# Patient Record
Sex: Female | Born: 1995
Health system: Southern US, Community
[De-identification: ages and names within clinical notes are randomized; demographics above are authoritative.]

## PROBLEM LIST (undated history)

## (undated) ENCOUNTER — Inpatient Hospital Stay (HOSPITAL_COMMUNITY): Payer: Self-pay

## (undated) DIAGNOSIS — I10 Essential (primary) hypertension: Secondary | ICD-10-CM

## (undated) DIAGNOSIS — E039 Hypothyroidism, unspecified: Secondary | ICD-10-CM

## (undated) DIAGNOSIS — R51 Headache: Secondary | ICD-10-CM

## (undated) DIAGNOSIS — K0889 Other specified disorders of teeth and supporting structures: Secondary | ICD-10-CM

## (undated) DIAGNOSIS — F419 Anxiety disorder, unspecified: Secondary | ICD-10-CM

## (undated) DIAGNOSIS — R519 Headache, unspecified: Secondary | ICD-10-CM

## (undated) DIAGNOSIS — F32A Depression, unspecified: Secondary | ICD-10-CM

## (undated) DIAGNOSIS — A749 Chlamydial infection, unspecified: Secondary | ICD-10-CM

## (undated) DIAGNOSIS — D573 Sickle-cell trait: Secondary | ICD-10-CM

## (undated) DIAGNOSIS — F329 Major depressive disorder, single episode, unspecified: Secondary | ICD-10-CM

## (undated) HISTORY — DX: Essential (primary) hypertension: I10

## (undated) HISTORY — DX: Hypothyroidism, unspecified: E03.9

## (undated) HISTORY — PX: HERNIA REPAIR: SHX51

---
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
## 2015-03-14 NOTE — L&D Delivery Note (Signed)
20 y.o. G1P0 at 5657w1d delivered a viable female infant in cephalic, LOA position.  nuchal cord x 1, easily reduced. right anterior shoulder delivered with ease. 60 sec delayed cord clamping. Cord clamped x2 and cut. Placenta delivered spontaneously intact, with 3VC. Fundus firm on exam with massage and pitocin. Good hemostasis noted.  Laceration: none Suture:  n/a EBL: 200cc Good hemostasis noted.  Mom and baby recovering in LDR.    Apgars: 6,8 Weight: pending, skin to skin, see delivery summary  Cord blood obtained: yes   WALLACE, NOAH I, DO PGY-3 02/09/2016, 12:23 PM   Midwife attestation: I was gloved and present for delivery in its entirety and I agree with the above resident's note.  Donette LarryMelanie Shanisha Lech, CNM 1:39 PM

## 2015-06-16 ENCOUNTER — Encounter: Payer: Self-pay | Admitting: *Deleted

## 2015-06-16 ENCOUNTER — Ambulatory Visit (INDEPENDENT_AMBULATORY_CARE_PROVIDER_SITE_OTHER): Payer: Self-pay | Admitting: *Deleted

## 2015-06-16 ENCOUNTER — Inpatient Hospital Stay (HOSPITAL_COMMUNITY)
Admission: AD | Admit: 2015-06-16 | Discharge: 2015-06-16 | Disposition: A | Discharge status: Home / Self Care | Payer: Medicaid Other | Source: Ambulatory Visit | Attending: Obstetrics & Gynecology | Admitting: Obstetrics & Gynecology

## 2015-06-16 DIAGNOSIS — Z3201 Encounter for pregnancy test, result positive: Secondary | ICD-10-CM

## 2015-06-16 DIAGNOSIS — Z3200 Encounter for pregnancy test, result unknown: Secondary | ICD-10-CM

## 2015-06-16 NOTE — MAU Note (Signed)
Pt presents to MAU stating that she wants to know how far pregnant she is. PT reports LMP 05/11/15. Positive home pregnancy test. Denies any pain of vaginal bleeding

## 2015-06-16 NOTE — Progress Notes (Signed)
Here for pregnancy test today which was positive. [facility]. Given proof of pregnancy.

## 2015-06-17 ENCOUNTER — Inpatient Hospital Stay (HOSPITAL_COMMUNITY)
Admission: AD | Admit: 2015-06-17 | Discharge: 2015-06-17 | Disposition: A | Discharge status: Home / Self Care | Payer: Medicaid Other | Source: Ambulatory Visit | Attending: Obstetrics & Gynecology | Admitting: Obstetrics & Gynecology

## 2015-06-17 ENCOUNTER — Inpatient Hospital Stay (HOSPITAL_COMMUNITY): Payer: Medicaid Other

## 2015-06-17 ENCOUNTER — Encounter (HOSPITAL_COMMUNITY): Payer: Self-pay | Admitting: *Deleted

## 2015-06-17 DIAGNOSIS — N760 Acute vaginitis: Secondary | ICD-10-CM

## 2015-06-17 DIAGNOSIS — O209 Hemorrhage in early pregnancy, unspecified: Secondary | ICD-10-CM | POA: Diagnosis not present

## 2015-06-17 DIAGNOSIS — B9689 Other specified bacterial agents as the cause of diseases classified elsewhere: Secondary | ICD-10-CM

## 2015-06-17 DIAGNOSIS — O23593 Infection of other part of genital tract in pregnancy, third trimester: Secondary | ICD-10-CM

## 2015-06-17 DIAGNOSIS — Z3A01 Less than 8 weeks gestation of pregnancy: Secondary | ICD-10-CM

## 2015-06-17 DIAGNOSIS — O4690 Antepartum hemorrhage, unspecified, unspecified trimester: Secondary | ICD-10-CM

## 2015-06-17 DIAGNOSIS — A499 Bacterial infection, unspecified: Secondary | ICD-10-CM

## 2015-06-17 LAB — CBC
HCT: 32.1 % — ABNORMAL LOW (ref 36.0–46.0)
Hemoglobin: 11.4 g/dL — ABNORMAL LOW (ref 12.0–15.0)
MCH: 31.7 pg (ref 26.0–34.0)
MCHC: 35.5 g/dL (ref 30.0–36.0)
MCV: 89.2 fL (ref 78.0–100.0)
PLATELETS: 193 10*3/uL (ref 150–400)
RBC: 3.6 MIL/uL — ABNORMAL LOW (ref 3.87–5.11)
RDW: 11.8 % (ref 11.5–15.5)
WBC: 3.4 10*3/uL — AB (ref 4.0–10.5)

## 2015-06-17 LAB — URINALYSIS, ROUTINE W REFLEX MICROSCOPIC
Bilirubin Urine: NEGATIVE
GLUCOSE, UA: NEGATIVE mg/dL
KETONES UR: NEGATIVE mg/dL
LEUKOCYTES UA: NEGATIVE
Nitrite: NEGATIVE
PH: 6 (ref 5.0–8.0)
Protein, ur: NEGATIVE mg/dL
SPECIFIC GRAVITY, URINE: 1.015 (ref 1.005–1.030)

## 2015-06-17 LAB — WET PREP, GENITAL
Sperm: NONE SEEN
TRICH WET PREP: NONE SEEN
Yeast Wet Prep HPF POC: NONE SEEN

## 2015-06-17 LAB — URINE MICROSCOPIC-ADD ON: WBC, UA: NONE SEEN WBC/hpf (ref 0–5)

## 2015-06-17 LAB — HCG, QUANTITATIVE, PREGNANCY: HCG, BETA CHAIN, QUANT, S: 5719 m[IU]/mL — AB (ref <5)

## 2015-06-17 LAB — ABO/RH: ABO/RH(D): O POS

## 2015-06-17 LAB — POCT PREGNANCY, URINE: Preg Test, Ur: POSITIVE — AB

## 2015-06-17 MED ORDER — METRONIDAZOLE 500 MG PO TABS: 500.0000 mg | ORAL_TABLET | Freq: Two times a day (BID) | ORAL | Status: DC

## 2015-06-17 NOTE — MAU Provider Note (Signed)
History     CSN: [id number]  Arrival date and time: 06/17/15 1159   None     No chief complaint on file.  HPI   Bonnie Cooper is a 20 yo AA [redacted]w[redacted]d G1P0 presenting to the Maternity Admissions Unit for vaginal bleeding and cramping. She noticed the bleeding this morning when she wiped after voiding and later noticed passing a small clot. She states the bleeding is not enough for a panty liner. Her cramping has been persistent in the lower abdomen.  Pt confirmed pregnancy at the clinic yesterday and was not having problems then.  Pt admits to some constipation. Her last BM was this morning without difficulty evacuating.  Pt denies any fever, chills, vaginal discharge, nausea, or vomiting.   RN note: ed at 06/17/2015 12:29 PM   Expand All Collapse All   Pt had pregnancy confirmed yesterday in clinic and started having bleeding when she wiped today. Reprots some mild cramping as well.       No past medical history on file.  No past surgical history on file.  No family history on file.  Social History  Substance Use Topics  . Smoking status: Not on file  . Smokeless tobacco: Not on file  . Alcohol Use: Not on file    Allergies: Allergies not on file  No prescriptions prior to admission    Review of Systems  Constitutional: Negative for fever and chills.  Gastrointestinal: Positive for abdominal pain and constipation. Negative for nausea, vomiting and diarrhea.  Genitourinary: Negative for dysuria.  Neurological: Negative for headaches.   Physical Exam   Blood pressure 110/79, pulse 85, temperature 98.7 F (37.1 C), temperature source Oral, resp. rate 18, height  (1.6 m), weight 149 lb 9.6 oz (67.858 kg), last menstrual period 05/11/2015.  Physical Exam  Nursing note and vitals reviewed. Constitutional: She is oriented to person, place, and time. She appears well-developed and well-nourished. No distress.  HENT:  Head: Normocephalic and atraumatic.  Eyes: Pupils  are equal, round, and reactive to light.  Neck: Normal range of motion. Neck supple.  Cardiovascular: Normal rate.   Respiratory: Effort normal.  GI: Soft. She exhibits no distension. There is no tenderness. There is no rebound and no guarding.  Musculoskeletal: Normal range of motion.  Neurological: She is alert and oriented to person, place, and time.  Skin: Skin is warm and dry.  Psychiatric: She has a normal mood and affect.   External genital exam was normal.  Speculum exam revealed opaque bloody discharge. Cervix was pink and clear. No CMT.  Uterus was appropriate and non-tender Adenxa without masses or tenderness   MAU Course  Procedures Results for orders placed or performed during the hospital encounter of 06/17/15 (from the past 24 hour(s))  Urinalysis, Routine w reflex microscopic (not at [facility])     Status: Abnormal   Collection Time: 06/17/15 12:33 PM  Result Value Ref Range   Color, Urine YELLOW YELLOW   APPearance CLEAR CLEAR   Specific Gravity, Urine 1.015 1.005 - 1.030   pH 6.0 5.0 - 8.0   Glucose, UA NEGATIVE NEGATIVE mg/dL   Hgb urine dipstick MODERATE (A) NEGATIVE   Bilirubin Urine NEGATIVE NEGATIVE   Ketones, ur NEGATIVE NEGATIVE mg/dL   Protein, ur NEGATIVE NEGATIVE mg/dL   Nitrite NEGATIVE NEGATIVE   Leukocytes, UA NEGATIVE NEGATIVE  Urine microscopic-add on     Status: Abnormal   Collection Time: 06/17/15 12:33 PM  Result Value Ref Range   Squamous  Epithelial / LPF 0-5 (A) NONE SEEN   WBC, UA NONE SEEN 0 - 5 WBC/hpf   RBC / HPF 0-5 0 - 5 RBC/hpf   Bacteria, UA RARE (A) NONE SEEN  Wet prep, genital     Status: Abnormal   Collection Time: 06/17/15 12:55 PM  Result Value Ref Range   Yeast Wet Prep HPF POC NONE SEEN NONE SEEN   Trich, Wet Prep NONE SEEN NONE SEEN   Clue Cells Wet Prep HPF POC PRESENT (A) NONE SEEN   WBC, Wet Prep HPF POC FEW (A) NONE SEEN   Sperm NONE SEEN   ABO/Rh     Status: None   Collection Time: 06/17/15 12:58 PM  Result Value  Ref Range   ABO/RH(D) O POS   hCG, quantitative, pregnancy     Status: Abnormal   Collection Time: 06/17/15 12:59 PM  Result Value Ref Range   hCG, Beta Chain, Quant, S 5719 (H) <5 mIU/mL  CBC     Status: Abnormal   Collection Time: 06/17/15 12:59 PM  Result Value Ref Range   WBC 3.4 (L) 4.0 - 10.5 K/uL   RBC 3.60 (L) 3.87 - 5.11 MIL/uL   Hemoglobin 11.4 (L) 12.0 - 15.0 g/dL   HCT 16.1 (L) 09.6 - 04.5 %   MCV 89.2 78.0 - 100.0 fL   MCH 31.7 26.0 - 34.0 pg   MCHC 35.5 30.0 - 36.0 g/dL   RDW 40.9 81.1 - 91.4 %   Platelets 193 150 - 400 K/uL  US Ob Comp Less 14 Wks  06/17/2015  CLINICAL DATA:  Vaginal bleeding. EXAM: OBSTETRIC <14 WK [place] AND TRANSVAGINAL OB US TECHNIQUE: Both transabdominal and transvaginal ultrasound examinations were performed for complete evaluation of the gestation as well as the maternal uterus, adnexal regions, and pelvic cul-de-sac. Transvaginal technique was performed to assess early pregnancy. COMPARISON:  None. FINDINGS: Intrauterine gestational sac: Visualized/normal in shape. Yolk sac:  Questionable small yolk sac. Embryo:  Not visualized. Cardiac Activity: Not visualized. MSD: 0.6 cm 5 w   2  d Subchorionic hemorrhage:  Questionable small. Maternal uterus/adnexae: Probable small right ovarian corpus luteal cyst. IMPRESSION: 1. Probable early intrauterine gestational sac, but no fetal pole, or cardiac activity yet visualized. Recommend follow-up quantitative B-HCG levels and follow-up US in 14 days to confirm and assess viability. This recommendation follows SRU consensus guidelines: Diagnostic Criteria for Nonviable Pregnancy Early in the First Trimester. Malva Limes Med 2013; 782:9562-13. 2. Questionable small subchorionic hemorrhage. Continued follow-up suggested. Electronically Signed   By: Maisie Fus  Register   On: 06/17/2015 16:11   US Ob Transvaginal  06/17/2015  CLINICAL DATA:  Vaginal bleeding. EXAM: OBSTETRIC <14 WK [place] AND TRANSVAGINAL OB US TECHNIQUE: Both  transabdominal and transvaginal ultrasound examinations were performed for complete evaluation of the gestation as well as the maternal uterus, adnexal regions, and pelvic cul-de-sac. Transvaginal technique was performed to assess early pregnancy. COMPARISON:  None. FINDINGS: Intrauterine gestational sac: Visualized/normal in shape. Yolk sac:  Questionable small yolk sac. Embryo:  Not visualized. Cardiac Activity: Not visualized. MSD: 0.6 cm 5 w   2  d Subchorionic hemorrhage:  Questionable small. Maternal uterus/adnexae: Probable small right ovarian corpus luteal cyst. IMPRESSION: 1. Probable early intrauterine gestational sac, but no fetal pole, or cardiac activity yet visualized. Recommend follow-up quantitative B-HCG levels and follow-up US in 14 days to confirm and assess viability. This recommendation follows SRU consensus guidelines: Diagnostic Criteria for Nonviable Pregnancy Early in the First Trimester. N Engl  J Med 2013; 161:0960-45; 369:1443-51. 2. Questionable small subchorionic hemorrhage. Continued follow-up suggested. Electronically Signed   By: Maisie Fushomas  Register   On: 06/17/2015 16:11  GC/chlamydia pending  Assessment and Plan  Bleeding in pregnancy- ?YS f/u in 48 hours for repeat HCG in MAU (Sat) BV- flagyl 500mg  BID for 7 days Pt instructed to return to MAU prior to scheduled visit if increase in pain or bleeding  Prather Failla 06/17/2015, 2:02 PM

## 2015-06-17 NOTE — MAU Note (Addendum)
Pt had pregnancy confirmed yesterday in clinic and started having bleeding when she wiped today. Reprots some mild cramping as well.

## 2015-06-18 LAB — HIV ANTIBODY (ROUTINE TESTING W REFLEX): HIV Screen 4th Generation wRfx: NONREACTIVE

## 2015-06-18 LAB — GC/CHLAMYDIA PROBE AMP (~~LOC~~) NOT AT ARMC
CHLAMYDIA, DNA PROBE: NEGATIVE
Neisseria Gonorrhea: NEGATIVE

## 2015-06-19 ENCOUNTER — Inpatient Hospital Stay (HOSPITAL_COMMUNITY)
Admission: AD | Admit: 2015-06-19 | Discharge: 2015-06-19 | Disposition: A | Discharge status: Home / Self Care | Payer: Medicaid Other | Source: Ambulatory Visit | Attending: Obstetrics & Gynecology | Admitting: Obstetrics & Gynecology

## 2015-06-19 DIAGNOSIS — Z3A01 Less than 8 weeks gestation of pregnancy: Secondary | ICD-10-CM | POA: Diagnosis not present

## 2015-06-19 DIAGNOSIS — O468X1 Other antepartum hemorrhage, first trimester: Secondary | ICD-10-CM | POA: Diagnosis not present

## 2015-06-19 DIAGNOSIS — O209 Hemorrhage in early pregnancy, unspecified: Secondary | ICD-10-CM

## 2015-06-19 LAB — HCG, QUANTITATIVE, PREGNANCY: HCG, BETA CHAIN, QUANT, S: 10911 m[IU]/mL — AB (ref <5)

## 2015-06-19 NOTE — MAU Provider Note (Signed)
History   Chief Complaint:  Follow-up   Bonnie Cooper is  20 y.o. G1P0 Patient's last menstrual period was 05/11/2015.Marland Kitchen Patient is here for follow up of quantitative HCG and ongoing surveillance of pregnancy status.   She is [redacted]w[redacted]d weeks gestation  by early ultrasound.    Since her last visit, the patient is without new complaint.   The patient reports bleeding as  brown.    General ROS:  negative  Her previous Quantitative HCG values are:  Ref. Range 06/17/2015 12:59  HCG, Beta Chain, Quant, S Latest Ref Range: <5 mIU/mL 5719 (H)    RN Note: "just hungry". Has had a little bit of cramping, some brown stuff came out yesterday, then some bleeding, little more brown stuff this morning    Physical Exam   Blood pressure 103/55, pulse 91, temperature 98.5 F (36.9 C), temperature source Oral, resp. rate 16, last menstrual period 05/11/2015.  Focused Gynecological Exam: exam declined by the patient                                                       She is awake and alert, in no apparent distress                                                       Heart rate RRR, breathing unlabored                                                       Ambulates without assistance  Labs: Results for orders placed or performed during the hospital encounter of 06/19/15 (from the past 24 hour(s))  hCG, quantitative, pregnancy   Collection Time: 06/19/15  9:18 AM  Result Value Ref Range   hCG, Beta Chain, Quant, S 10911 (H) <5 mIU/mL    Ultrasound Studies:   US Ob Comp Less 14 Wks  06/17/2015  CLINICAL DATA:  Vaginal bleeding. EXAM: OBSTETRIC <14 WK [place] AND TRANSVAGINAL OB US TECHNIQUE: Both transabdominal and transvaginal ultrasound examinations were performed for complete evaluation of the gestation as well as the maternal uterus, adnexal regions, and pelvic cul-de-sac. Transvaginal technique was performed to assess early pregnancy. COMPARISON:  None. FINDINGS: Intrauterine gestational sac:  Visualized/normal in shape. Yolk sac:  Questionable small yolk sac. Embryo:  Not visualized. Cardiac Activity: Not visualized. MSD: 0.6 cm 5 w   2  d Subchorionic hemorrhage:  Questionable small. Maternal uterus/adnexae: Probable small right ovarian corpus luteal cyst. IMPRESSION: 1. Probable early intrauterine gestational sac, but no fetal pole, or cardiac activity yet visualized. Recommend follow-up quantitative B-HCG levels and follow-up US in 14 days to confirm and assess viability. This recommendation follows SRU consensus guidelines: Diagnostic Criteria for Nonviable Pregnancy Early in the First Trimester. Malva Limes Med 2013; 161:0960-45. 2. Questionable small subchorionic hemorrhage. Continued follow-up suggested. Electronically Signed   By: Maisie Fus  Register   On: 06/17/2015 16:11   US Ob Transvaginal  06/17/2015  CLINICAL DATA:  Vaginal bleeding. EXAM: OBSTETRIC <14 WK [place]  AND TRANSVAGINAL OB US TECHNIQUE: Both transabdominal and transvaginal ultrasound examinations were performed for complete evaluation of the gestation as well as the maternal uterus, adnexal regions, and pelvic cul-de-sac. Transvaginal technique was performed to assess early pregnancy. COMPARISON:  None. FINDINGS: Intrauterine gestational sac: Visualized/normal in shape. Yolk sac:  Questionable small yolk sac. Embryo:  Not visualized. Cardiac Activity: Not visualized. MSD: 0.6 cm 5 w   2  d Subchorionic hemorrhage:  Questionable small. Maternal uterus/adnexae: Probable small right ovarian corpus luteal cyst. IMPRESSION: 1. Probable early intrauterine gestational sac, but no fetal pole, or cardiac activity yet visualized. Recommend follow-up quantitative B-HCG levels and follow-up US in 14 days to confirm and assess viability. This recommendation follows SRU consensus guidelines: Diagnostic Criteria for Nonviable Pregnancy Early in the First Trimester. Malva Limes Engl J Med 2013; 962:9528-41; 369:1443-51. 2. Questionable small subchorionic hemorrhage. Continued  follow-up suggested. Electronically Signed   By: Maisie Fushomas  Register   On: 06/17/2015 16:11    Assessment:  1469w4d weeks gestation   Quant HCG levels doubled appropriately   Plan: The patient is instructed to follow up in in the next few days For follow up ultrasound late next week.  [facility] Gastroenterology IncWILLIAMS,MARIE 06/19/2015, 10:40 AM

## 2015-06-19 NOTE — MAU Note (Signed)
"  just hungry".  Has had a little bit of cramping, some brown stuff came out yesterday, then some bleeding, little more brown stuff this morning

## 2015-06-19 NOTE — Discharge Instructions (Signed)

## 2015-06-29 ENCOUNTER — Ambulatory Visit (HOSPITAL_COMMUNITY): Payer: Self-pay | Attending: Advanced Practice Midwife

## 2015-07-07 ENCOUNTER — Inpatient Hospital Stay (HOSPITAL_COMMUNITY)
Admission: AD | Admit: 2015-07-07 | Discharge: 2015-07-07 | Disposition: A | Discharge status: Home / Self Care | Payer: Medicaid Other | Source: Ambulatory Visit | Attending: Obstetrics and Gynecology | Admitting: Obstetrics and Gynecology

## 2015-07-07 ENCOUNTER — Inpatient Hospital Stay (HOSPITAL_COMMUNITY): Payer: Medicaid Other

## 2015-07-07 ENCOUNTER — Encounter (HOSPITAL_COMMUNITY): Payer: Self-pay | Admitting: *Deleted

## 2015-07-07 DIAGNOSIS — R109 Unspecified abdominal pain: Secondary | ICD-10-CM | POA: Diagnosis not present

## 2015-07-07 DIAGNOSIS — B373 Candidiasis of vulva and vagina: Secondary | ICD-10-CM

## 2015-07-07 DIAGNOSIS — O26899 Other specified pregnancy related conditions, unspecified trimester: Secondary | ICD-10-CM

## 2015-07-07 DIAGNOSIS — D573 Sickle-cell trait: Secondary | ICD-10-CM | POA: Insufficient documentation

## 2015-07-07 DIAGNOSIS — O26891 Other specified pregnancy related conditions, first trimester: Secondary | ICD-10-CM | POA: Insufficient documentation

## 2015-07-07 DIAGNOSIS — B3731 Acute candidiasis of vulva and vagina: Secondary | ICD-10-CM

## 2015-07-07 DIAGNOSIS — O98811 Other maternal infectious and parasitic diseases complicating pregnancy, first trimester: Secondary | ICD-10-CM | POA: Insufficient documentation

## 2015-07-07 DIAGNOSIS — Z3A08 8 weeks gestation of pregnancy: Secondary | ICD-10-CM | POA: Insufficient documentation

## 2015-07-07 DIAGNOSIS — O99011 Anemia complicating pregnancy, first trimester: Secondary | ICD-10-CM | POA: Diagnosis not present

## 2015-07-07 DIAGNOSIS — O9989 Other specified diseases and conditions complicating pregnancy, childbirth and the puerperium: Secondary | ICD-10-CM

## 2015-07-07 DIAGNOSIS — Z3491 Encounter for supervision of normal pregnancy, unspecified, first trimester: Secondary | ICD-10-CM

## 2015-07-07 HISTORY — DX: Headache: R51

## 2015-07-07 HISTORY — DX: Sickle-cell trait: D57.3

## 2015-07-07 HISTORY — DX: Major depressive disorder, single episode, unspecified: F32.9

## 2015-07-07 HISTORY — DX: Chlamydial infection, unspecified: A74.9

## 2015-07-07 HISTORY — DX: Anxiety disorder, unspecified: F41.9

## 2015-07-07 HISTORY — DX: Headache, unspecified: R51.9

## 2015-07-07 HISTORY — DX: Depression, unspecified: F32.A

## 2015-07-07 LAB — URINALYSIS, ROUTINE W REFLEX MICROSCOPIC
BILIRUBIN URINE: NEGATIVE
GLUCOSE, UA: NEGATIVE mg/dL
Nitrite: NEGATIVE
PH: 5.5 (ref 5.0–8.0)
Protein, ur: NEGATIVE mg/dL
Specific Gravity, Urine: 1.025 (ref 1.005–1.030)

## 2015-07-07 LAB — URINE MICROSCOPIC-ADD ON: RBC / HPF: NONE SEEN RBC/hpf (ref 0–5)

## 2015-07-07 MED ORDER — TERCONAZOLE 0.8 % VA CREA: 1.0000 | TOPICAL_CREAM | Freq: Every day | VAGINAL | Status: DC

## 2015-07-07 NOTE — Discharge Instructions (Signed)
Monilial Vaginitis °Vaginitis in a soreness, swelling and redness (inflammation) of the vagina and vulva. Monilial vaginitis is not a sexually transmitted infection. °CAUSES  °Yeast vaginitis is caused by yeast (candida) that is normally found in your vagina. With a yeast infection, the candida has overgrown in number to a point that upsets the chemical balance. °SYMPTOMS  °· White, thick vaginal discharge. °· Swelling, itching, redness and irritation of the vagina and possibly the lips of the vagina (vulva). °· Burning or painful urination. °· Painful intercourse. °DIAGNOSIS  °Things that may contribute to monilial vaginitis are: °· Postmenopausal and virginal states. °· Pregnancy. °· Infections. °· Being tired, sick or stressed, especially if you had monilial vaginitis in the past. °· Diabetes. Good control will help lower the chance. °· Birth control pills. °· Tight fitting garments. °· Using bubble bath, feminine sprays, douches or deodorant tampons. °· Taking certain medications that kill germs (antibiotics). °· Sporadic recurrence can occur if you become ill. °TREATMENT  °Your caregiver will give you medication. °· There are several kinds of anti monilial vaginal creams and suppositories specific for monilial vaginitis. For recurrent yeast infections, use a suppository or cream in the vagina 2 times a week, or as directed. °· Anti-monilial or steroid cream for the itching or irritation of the vulva may also be used. Get your caregiver's permission. °· Painting the vagina with methylene blue solution may help if the monilial cream does not work. °· Eating yogurt may help prevent monilial vaginitis. °HOME CARE INSTRUCTIONS  °· Finish all medication as prescribed. °· Do not have sex until treatment is completed or after your caregiver tells you it is okay. °· Take warm sitz baths. °· Do not douche. °· Do not use tampons, especially scented ones. °· Wear cotton underwear. °· Avoid tight pants and panty  hose. °· Tell your sexual partner that you have a yeast infection. They should go to their caregiver if they have symptoms such as mild rash or itching. °· Your sexual partner should be treated as well if your infection is difficult to eliminate. °· Practice safer sex. Use condoms. °· Some vaginal medications cause latex condoms to fail. Vaginal medications that harm condoms are: °· Cleocin cream. °· Butoconazole (Femstat®). °· Terconazole (Terazol®) vaginal suppository. °· Miconazole (Monistat®) (may be purchased over the counter). °SEEK MEDICAL CARE IF:  °· You have a temperature by mouth above 102° F (38.9° C). °· The infection is getting worse after 2 days of treatment. °· The infection is not getting better after 3 days of treatment. °· You develop blisters in or around your vagina. °· You develop vaginal bleeding, and it is not your menstrual period. °· You have pain when you urinate. °· You develop intestinal problems. °· You have pain with sexual intercourse. °  °This information is not intended to replace advice given to you by your health care provider. Make sure you discuss any questions you have with your health care provider. °  °Document Released: 12/07/2004 Document Revised: 05/22/2011 Document Reviewed: 08/31/2014 °Elsevier Interactive Patient Education ©2016 Elsevier Inc. °First Trimester of Pregnancy °The first trimester of pregnancy is from week 1 until the end of week 12 (months 1 through 3). During this time, your baby will begin to develop inside you. At 6-8 weeks, the eyes and face are formed, and the heartbeat can be seen on ultrasound. At the end of 12 weeks, all the baby's organs are formed. Prenatal care is all the medical care you receive before the   birth of your baby. Make sure you get good prenatal care and follow all of your doctor's instructions. HOME CARE  Medicines  Take medicine only as told by your doctor. Some medicines are safe and some are not during pregnancy.  Take your  prenatal vitamins as told by your doctor.  Take medicine that helps you poop (stool softener) as needed if your doctor says it is okay. Diet  Eat regular, healthy meals.  Your doctor will tell you the amount of weight gain that is right for you.  Avoid raw meat and uncooked cheese.  If you feel sick to your stomach (nauseous) or throw up (vomit):  Eat 4 or 5 small meals a day instead of 3 large meals.  Try eating a few soda crackers.  Drink liquids between meals instead of during meals.  If you have a hard time pooping (constipation):  Eat high-fiber foods like fresh vegetables, fruit, and whole grains.  Drink enough fluids to keep your pee (urine) clear or pale yellow. Activity and Exercise  Exercise only as told by your doctor. Stop exercising if you have cramps or pain in your lower belly (abdomen) or low back.  Try to avoid standing for long periods of time. Move your legs often if you must stand in one place for a long time.  Avoid heavy lifting.  Wear low-heeled shoes. Sit and stand up straight.  You can have sex unless your doctor tells you not to. Relief of Pain or Discomfort  Wear a good support bra if your breasts are sore.  Take warm water baths (sitz baths) to soothe pain or discomfort caused by hemorrhoids. Use hemorrhoid cream if your doctor says it is okay.  Rest with your legs raised if you have leg cramps or low back pain.  Wear support hose if you have puffy, bulging veins (varicose veins) in your legs. Raise (elevate) your feet for 15 minutes, 3-4 times a day. Limit salt in your diet. Prenatal Care  Schedule your prenatal visits by the twelfth week of pregnancy.  Write down your questions. Take them to your prenatal visits.  Keep all your prenatal visits as told by your doctor. Safety  Wear your seat belt at all times when driving.  Make a list of emergency phone numbers. The list should include numbers for family, friends, the hospital, and  police and fire departments. General Tips  Ask your doctor for a referral to a local prenatal class. Begin classes no later than at the start of month 6 of your pregnancy.  Ask for help if you need counseling or help with nutrition. Your doctor can give you advice or tell you where to go for help.  Do not use hot tubs, steam rooms, or saunas.  Do not douche or use tampons or scented sanitary pads.  Do not cross your legs for long periods of time.  Avoid litter boxes and soil used by cats.  Avoid all smoking, herbs, and alcohol. Avoid drugs not approved by your doctor.  Do not use any tobacco products, including cigarettes, chewing tobacco, and electronic cigarettes. If you need help quitting, ask your doctor. You may get counseling or other support to help you quit.  Visit your dentist. At home, brush your teeth with a soft toothbrush. Be gentle when you floss. GET HELP IF:  You are dizzy.  You have mild cramps or pressure in your lower belly.  You have a nagging pain in your belly area.  You continue  to feel sick to your stomach, throw up, or have watery poop (diarrhea).  You have a bad smelling fluid coming from your vagina.  You have pain with peeing (urination).  You have increased puffiness (swelling) in your face, hands, legs, or ankles. GET HELP RIGHT AWAY IF:   You have a fever.  You are leaking fluid from your vagina.  You have spotting or bleeding from your vagina.  You have very bad belly cramping or pain.  You gain or lose weight rapidly.  You throw up blood. It may look like coffee grounds.  You are around people who have [place] measles, fifth disease, or chickenpox.  You have a very bad headache.  You have shortness of breath.  You have any kind of trauma, such as from a fall or a car accident.   This information is not intended to replace advice given to you by your health care provider. Make sure you discuss any questions you have with your  health care provider.   Document Released: 08/16/2007 Document Revised: 03/20/2014 Document Reviewed: 01/07/2013 Elsevier Interactive Patient Education [employer]2016 Elsevier Inc.

## 2015-07-07 NOTE — MAU Note (Signed)
Pain started last night at work. Describes as intermttant and sharp. States it happened twice, last time around 0300.Pink discharge for about a week.

## 2015-07-07 NOTE — MAU Provider Note (Signed)
Assumed care from Vonzella NippleJulie Wenzel PA @ [phone].  Ultrasound results pending.  Patient declines pelvic exam. Is complaining of vaginal irritation, unsure if she's having discharge; pt thinks she has a yeast infections. Yeast found on u/a so will treat.  Ultrasound shows SIUP with cardiac activity.  Patient plans on getting care with [facility].   A: 1. Normal IUP (intrauterine pregnancy) on prenatal ultrasound, first trimester   2. Abdominal pain affecting pregnancy, antepartum   3. Vaginal yeast infection     P; Discharge home Rx terazol Continues prenatal vitamins Start prenatal care Discussed reasons to return to MAU  Judeth HornErin Petina Muraski, NP

## 2015-07-07 NOTE — MAU Provider Note (Signed)
  History     CSN: [id number]  Arrival date and time: 07/07/15 [id number]   First Provider Initiated Contact with Patient 07/07/15 [phone]      Chief Complaint  Patient presents with  . Abdominal Pain   HPI  Ms. Bonnie Cooper is a 20 y.o. G1P0 at 7732w1d who presents to MAU today with complaint of abdominal pain. The patient states pain is sharp and intermittent. Pain started last night while at work. She has not taken anything for pain. She states pink discharge x 1 week, but denies heavy vaginal bleeding. She denies UTI symptoms or fever. She has had nausea recently and one episode of emesis this morning.   OB History    Gravida Para Term Preterm AB TAB SAB Ectopic Multiple Living   1               Past Medical History  Diagnosis Date  . Anxiety   . Headache   . Depression     just recently, no meds. or tx  . Chlamydia   . Sickle cell trait [facility](HCC)     Past Surgical History  Procedure Laterality Date  . Navel  2001  . Hernia repair      History reviewed. No pertinent family history.  Social History  Substance Use Topics  . Smoking status: Never Smoker   . Smokeless tobacco: None  . Alcohol Use: No     Comment: 1 year ago    Allergies: No Known Allergies  Prescriptions prior to admission  Medication Sig Dispense Refill Last Dose  . metroNIDAZOLE (FLAGYL) 500 MG tablet Take 1 tablet (500 mg total) by mouth 2 (two) times daily. 14 tablet 0     Review of Systems  Constitutional: Negative for fever and malaise/fatigue.  Gastrointestinal: Positive for nausea, vomiting and abdominal pain. Negative for diarrhea and constipation.  Genitourinary: Negative for dysuria, urgency and frequency.       + spotting   Physical Exam   Blood pressure 107/67, pulse 82, temperature 99.2 F (37.3 C), temperature source Oral, resp. rate 18, last menstrual period 05/11/2015.  Physical Exam  Nursing note and vitals reviewed. Constitutional: She is oriented to person, place, and time. She  appears well-developed and well-nourished. No distress.  HENT:  Head: Normocephalic and atraumatic.  Cardiovascular: Normal rate.   Respiratory: Effort normal.  GI: Soft. She exhibits no distension and no mass. There is no tenderness. There is no rebound and no guarding.  Neurological: She is alert and oriented to person, place, and time.  Skin: Skin is warm and dry. No erythema.  Psychiatric: She has a normal mood and affect.   No results found for this or any previous visit (from the past 24 hour(s)).  No results found.  MAU Course  Procedures None  MDM Reviewed notes and results from previous visit. US showed IUGS with possible YS. Follow-up US was ordered and never scheduled.  UA ordered. US transvaginal OB ordered to confirm IUP 0920 - patient in US. Care turned over to Judeth HornErin Lawrence, NP  Marny LowensteinJulie N Wenzel, PA-C  07/07/2015, 9:19 AM  Assessment and Plan

## 2015-07-26 ENCOUNTER — Other Ambulatory Visit (HOSPITAL_COMMUNITY): Payer: Self-pay | Admitting: Nurse Practitioner

## 2015-07-26 DIAGNOSIS — Z3A13 13 weeks gestation of pregnancy: Secondary | ICD-10-CM

## 2015-07-26 DIAGNOSIS — Z3682 Encounter for antenatal screening for nuchal translucency: Secondary | ICD-10-CM

## 2015-07-26 LAB — OB RESULTS CONSOLE RPR: RPR: NONREACTIVE

## 2015-07-26 LAB — OB RESULTS CONSOLE RUBELLA ANTIBODY, IGM: RUBELLA: IMMUNE

## 2015-07-26 LAB — OB RESULTS CONSOLE GC/CHLAMYDIA: Gonorrhea: NEGATIVE

## 2015-07-26 LAB — OB RESULTS CONSOLE HEPATITIS B SURFACE ANTIGEN: Hepatitis B Surface Ag: NEGATIVE

## 2015-08-11 ENCOUNTER — Ambulatory Visit (HOSPITAL_COMMUNITY)
Admission: RE | Admit: 2015-08-11 | Discharge: 2015-08-11 | Disposition: A | Discharge status: Home / Self Care | Payer: Medicaid Other | Source: Ambulatory Visit | Attending: Nurse Practitioner | Admitting: Nurse Practitioner

## 2015-08-11 ENCOUNTER — Encounter (HOSPITAL_COMMUNITY): Payer: Self-pay

## 2015-08-11 DIAGNOSIS — Z36 Encounter for antenatal screening of mother: Secondary | ICD-10-CM | POA: Insufficient documentation

## 2015-08-11 DIAGNOSIS — Z3A13 13 weeks gestation of pregnancy: Secondary | ICD-10-CM | POA: Insufficient documentation

## 2015-08-11 DIAGNOSIS — Z3682 Encounter for antenatal screening for nuchal translucency: Secondary | ICD-10-CM

## 2015-08-20 ENCOUNTER — Other Ambulatory Visit (HOSPITAL_COMMUNITY): Payer: Self-pay

## 2015-08-23 ENCOUNTER — Inpatient Hospital Stay (HOSPITAL_COMMUNITY)
Admission: AD | Admit: 2015-08-23 | Discharge: 2015-08-23 | Disposition: A | Discharge status: Home / Self Care | Payer: Medicaid Other | Source: Ambulatory Visit | Attending: Obstetrics and Gynecology | Admitting: Obstetrics and Gynecology

## 2015-08-23 ENCOUNTER — Encounter (HOSPITAL_COMMUNITY): Payer: Self-pay | Admitting: *Deleted

## 2015-08-23 DIAGNOSIS — O26892 Other specified pregnancy related conditions, second trimester: Secondary | ICD-10-CM | POA: Diagnosis not present

## 2015-08-23 DIAGNOSIS — D573 Sickle-cell trait: Secondary | ICD-10-CM | POA: Insufficient documentation

## 2015-08-23 DIAGNOSIS — W109XXA Fall (on) (from) unspecified stairs and steps, initial encounter: Secondary | ICD-10-CM | POA: Insufficient documentation

## 2015-08-23 DIAGNOSIS — Z3A14 14 weeks gestation of pregnancy: Secondary | ICD-10-CM

## 2015-08-23 DIAGNOSIS — O9989 Other specified diseases and conditions complicating pregnancy, childbirth and the puerperium: Secondary | ICD-10-CM | POA: Diagnosis not present

## 2015-08-23 DIAGNOSIS — T1490 Injury, unspecified: Secondary | ICD-10-CM

## 2015-08-23 DIAGNOSIS — O9A212 Injury, poisoning and certain other consequences of external causes complicating pregnancy, second trimester: Secondary | ICD-10-CM

## 2015-08-23 DIAGNOSIS — R109 Unspecified abdominal pain: Secondary | ICD-10-CM | POA: Diagnosis not present

## 2015-08-23 DIAGNOSIS — W19XXXA Unspecified fall, initial encounter: Secondary | ICD-10-CM

## 2015-08-23 DIAGNOSIS — O26899 Other specified pregnancy related conditions, unspecified trimester: Secondary | ICD-10-CM

## 2015-08-23 LAB — URINALYSIS, ROUTINE W REFLEX MICROSCOPIC
BILIRUBIN URINE: NEGATIVE
GLUCOSE, UA: NEGATIVE mg/dL
Hgb urine dipstick: NEGATIVE
KETONES UR: NEGATIVE mg/dL
Leukocytes, UA: NEGATIVE
Nitrite: NEGATIVE
PH: 6.5 (ref 5.0–8.0)
Protein, ur: NEGATIVE mg/dL
Specific Gravity, Urine: 1.01 (ref 1.005–1.030)

## 2015-08-23 NOTE — MAU Note (Signed)
Pt presents to MAU with complaints of tripping and falling last night landing on the right upper portion of her abdomen. Denies any vaginal bleeding or abnormal discharge. Reports soreness

## 2015-08-23 NOTE — MAU Provider Note (Signed)
History     CSN: [id number]  Arrival date and time: 08/23/15 4098   First Provider Initiated Contact with Patient 08/23/15 1007      Chief Complaint  Patient presents with  . Fall   HPI   [name] is a 20 y.o. female G1P0 at [redacted]w[redacted]d here in MAU with concerns after a fall she endured yesterday evening. She was walking down the stairs and tripped and fell onto her side. She did not directly hit her abdomen.   She [facility] and has an appointment with them on Thurs.   She denies vaginal bleeding.  She is having mild cramping in her lower abdomen. She rates the pain 3/10; the pain is constant, however mild in nature.  She denies leaking of fluid.   OB History    Gravida Para Term Preterm AB TAB SAB Ectopic Multiple Living   1               Past Medical History  Diagnosis Date  . Anxiety   . Headache   . Depression     just recently, no meds. or tx  . Chlamydia   . Sickle cell trait [facility])     Past Surgical History  Procedure Laterality Date  . Navel  2001  . Hernia repair      History reviewed. No pertinent family history.  Social History  Substance Use Topics  . Smoking status: Never Smoker   . Smokeless tobacco: None  . Alcohol Use: No     Comment: 1 year ago    Allergies: No Known Allergies  Prescriptions prior to admission  Medication Sig Dispense Refill Last Dose  . terconazole (TERAZOL 3) 0.8 % vaginal cream Place 1 applicator vaginally at bedtime. 20 g 0    Results for orders placed or performed during the hospital encounter of 08/23/15 (from the past 48 hour(s))  Urinalysis, Routine w reflex microscopic (not at [facility])     Status: None   Collection Time: 08/23/15  9:36 AM  Result Value Ref Range   Color, Urine YELLOW YELLOW   APPearance CLEAR CLEAR   Specific Gravity, Urine 1.010 1.005 - 1.030   pH 6.5 5.0 - 8.0   Glucose, UA NEGATIVE NEGATIVE mg/dL   Hgb urine dipstick NEGATIVE NEGATIVE   Bilirubin Urine  NEGATIVE NEGATIVE   Ketones, ur NEGATIVE NEGATIVE mg/dL   Protein, ur NEGATIVE NEGATIVE mg/dL   Nitrite NEGATIVE NEGATIVE   Leukocytes, UA NEGATIVE NEGATIVE    Comment: MICROSCOPIC NOT DONE ON URINES WITH NEGATIVE PROTEIN, BLOOD, LEUKOCYTES, NITRITE, OR GLUCOSE <1000 mg/dL.    Review of Systems  Constitutional: Negative for fever and chills.  Gastrointestinal: Positive for abdominal pain.  Genitourinary: Negative for dysuria.   Physical Exam   Blood pressure 110/70, pulse 89, temperature 99.3 F (37.4 C), resp. rate 18, last menstrual period 05/11/2015.  Physical Exam  Constitutional: She is oriented to person, place, and time. She appears well-developed and well-nourished. No distress.  HENT:  Head: Normocephalic.  Respiratory: Effort normal.  GI: Soft. She exhibits no distension. There is no tenderness. There is no rebound.  Genitourinary:  Cervix: closed, anterior   Musculoskeletal: Normal range of motion.  Neurological: She is alert and oriented to person, place, and time.  Skin: Skin is warm. She is not diaphoretic.  Psychiatric: Her behavior is normal.    MAU Course  Procedures  None  MDM  + fetal heart tones via doppler O  positive blood type   Assessment and Plan    A:  1. Fall, initial encounter   2. Abdominal cramping affecting pregnancy     P:  Discharge home in stable condition Pelvic rest Ok to take tylenol as directed on the bottle as needed Return to MAU if symptoms worsen; vaginal bleeding or worsening pain First trimester warning signs.    Duane LopeJennifer I Rasch, NP 08/23/2015 10:40 AM

## 2015-08-23 NOTE — Discharge Instructions (Signed)
Fall Prevention in the Home  Falls can cause injuries and can affect people from all age groups. There are many simple things that you can do to make your home safe and to help prevent falls. WHAT CAN I DO ON THE OUTSIDE OF MY HOME?  Regularly repair the edges of walkways and driveways and fix any cracks.  Remove high doorway thresholds.  Trim any shrubbery on the main path into your home.  Use bright outdoor lighting.  Clear walkways of debris and clutter, including tools and rocks.  Regularly check that handrails are securely fastened and in good repair. Both sides of any steps should have handrails.  Install guardrails along the edges of any raised decks or porches.  Have leaves, snow, and ice cleared regularly.  Use sand or salt on walkways during winter months.  In the garage, clean up any spills right away, including grease or oil spills. WHAT CAN I DO IN THE BATHROOM?  Use night lights.  Install grab bars by the toilet and in the tub and shower. Do not use towel bars as grab bars.  Use non-skid mats or decals on the floor of the tub or shower.  If you need to sit down while you are in the shower, use a plastic, non-slip stool..  Keep the floor dry. Immediately clean up any water that spills on the floor.  Remove soap buildup in the tub or shower on a regular basis.  Attach bath mats securely with double-sided non-slip rug tape.  Remove throw rugs and other tripping hazards from the floor. WHAT CAN I DO IN THE BEDROOM?  Use night lights.  Make sure that a bedside light is easy to reach.  Do not use oversized bedding that drapes onto the floor.  Have a firm chair that has side arms to use for getting dressed.  Remove throw rugs and other tripping hazards from the floor. WHAT CAN I DO IN THE KITCHEN?   Clean up any spills right away.  Avoid walking on wet floors.  Place frequently used items in easy-to-reach places.  If you need to reach for something  above you, use a sturdy step stool that has a grab bar.  Keep electrical cables out of the way.  Do not use floor polish or wax that makes floors slippery. If you have to use wax, make sure that it is non-skid floor wax.  Remove throw rugs and other tripping hazards from the floor. WHAT CAN I DO IN THE STAIRWAYS?  Do not leave any items on the stairs.  Make sure that there are handrails on both sides of the stairs. Fix handrails that are broken or loose. Make sure that handrails are as long as the stairways.  Check any carpeting to make sure that it is firmly attached to the stairs. Fix any carpet that is loose or worn.  Avoid having throw rugs at the top or bottom of stairways, or secure the rugs with carpet tape to prevent them from moving.  Make sure that you have a light switch at the top of the stairs and the bottom of the stairs. If you do not have them, have them installed. WHAT ARE SOME OTHER FALL PREVENTION TIPS?  Wear closed-toe shoes that fit well and support your feet. Wear shoes that have rubber soles or low heels.  When you use a stepladder, make sure that it is completely opened and that the sides are firmly locked. Have someone hold the ladder while you   are using it. Do not climb a closed stepladder.  Add color or contrast paint or tape to grab bars and handrails in your home. Place contrasting color strips on the first and last steps.  Use mobility aids as needed, such as canes, walkers, scooters, and crutches.  Turn on lights if it is dark. Replace any light bulbs that burn out.  Set up furniture so that there are clear paths. Keep the furniture in the same spot.  Fix any uneven floor surfaces.  Choose a carpet design that does not hide the edge of steps of a stairway.  Be aware of any and all pets.  Review your medicines with your healthcare provider. Some medicines can cause dizziness or changes in blood pressure, which increase your risk of falling. Talk  with your health care provider about other ways that you can decrease your risk of falls. This may include working with a physical therapist or trainer to improve your strength, balance, and endurance.   This information is not intended to replace advice given to you by your health care provider. Make sure you discuss any questions you have with your health care provider.   Document Released: 02/17/2002 Document Revised: 07/14/2014 Document Reviewed: 04/03/2014 Elsevier Interactive Patient Education 2016 Elsevier Inc.  

## 2015-11-01 ENCOUNTER — Inpatient Hospital Stay (HOSPITAL_COMMUNITY)
Admission: AD | Admit: 2015-11-01 | Discharge: 2015-11-01 | Disposition: A | Discharge status: Home / Self Care | Payer: Medicaid Other | Source: Ambulatory Visit | Attending: Family Medicine | Admitting: Family Medicine

## 2015-11-01 ENCOUNTER — Encounter (HOSPITAL_COMMUNITY): Payer: Self-pay | Admitting: *Deleted

## 2015-11-01 DIAGNOSIS — D573 Sickle-cell trait: Secondary | ICD-10-CM | POA: Insufficient documentation

## 2015-11-01 DIAGNOSIS — M549 Dorsalgia, unspecified: Secondary | ICD-10-CM

## 2015-11-01 DIAGNOSIS — N760 Acute vaginitis: Secondary | ICD-10-CM | POA: Insufficient documentation

## 2015-11-01 DIAGNOSIS — O26892 Other specified pregnancy related conditions, second trimester: Secondary | ICD-10-CM

## 2015-11-01 DIAGNOSIS — M545 Low back pain: Secondary | ICD-10-CM | POA: Diagnosis not present

## 2015-11-01 DIAGNOSIS — Z3A24 24 weeks gestation of pregnancy: Secondary | ICD-10-CM | POA: Diagnosis not present

## 2015-11-01 DIAGNOSIS — B9689 Other specified bacterial agents as the cause of diseases classified elsewhere: Secondary | ICD-10-CM

## 2015-11-01 DIAGNOSIS — O9989 Other specified diseases and conditions complicating pregnancy, childbirth and the puerperium: Secondary | ICD-10-CM

## 2015-11-01 DIAGNOSIS — F419 Anxiety disorder, unspecified: Secondary | ICD-10-CM | POA: Insufficient documentation

## 2015-11-01 DIAGNOSIS — O99891 Other specified diseases and conditions complicating pregnancy: Secondary | ICD-10-CM

## 2015-11-01 LAB — WET PREP, GENITAL
Sperm: NONE SEEN
TRICH WET PREP: NONE SEEN
Yeast Wet Prep HPF POC: NONE SEEN

## 2015-11-01 LAB — URINALYSIS, ROUTINE W REFLEX MICROSCOPIC
Bilirubin Urine: NEGATIVE
GLUCOSE, UA: NEGATIVE mg/dL
HGB URINE DIPSTICK: NEGATIVE
KETONES UR: NEGATIVE mg/dL
Nitrite: NEGATIVE
PH: 6 (ref 5.0–8.0)
PROTEIN: NEGATIVE mg/dL
Specific Gravity, Urine: 1.015 (ref 1.005–1.030)

## 2015-11-01 LAB — URINE MICROSCOPIC-ADD ON: RBC / HPF: NONE SEEN RBC/hpf (ref 0–5)

## 2015-11-01 MED ORDER — METRONIDAZOLE 500 MG PO TABS: 500.0000 mg | ORAL_TABLET | Freq: Two times a day (BID) | ORAL | 0 refills | Status: DC

## 2015-11-01 NOTE — MAU Note (Signed)
Pt C/O left lower back pain since last night, continues today.  Denies vaginal bleeding or dysuria.  Reports FM.

## 2015-11-01 NOTE — Discharge Instructions (Signed)
Bacterial Vaginosis Bacterial vaginosis is an infection of the vagina. It happens when too many germs (bacteria) grow in the vagina. Having this infection puts you at risk for getting other infections from sex. Treating this infection can help lower your risk for other infections, such as:   Chlamydia.  Gonorrhea.  HIV.  Herpes. HOME CARE  Take your medicine as told by your doctor.  Finish your medicine even if you start to feel better.  Tell your sex partner that you have an infection. They should see their doctor for treatment.  During treatment:  Avoid sex or use condoms correctly.  Do not douche.  Do not drink alcohol unless your doctor tells you it is ok.  Do not breastfeed unless your doctor tells you it is ok. GET HELP IF:  You are not getting better after 3 days of treatment.  You have more grey fluid (discharge) coming from your vagina than before.  You have more pain than before.  You have a fever. MAKE SURE YOU:   Understand these instructions.  Will watch your condition.  Will get help right away if you are not doing well or get worse.   This information is not intended to replace advice given to you by your health care provider. Make sure you discuss any questions you have with your health care provider.   Document Released: 12/07/2007 Document Revised: 03/20/2014 Document Reviewed: 10/09/2012 Elsevier Interactive Patient Education 2016 [employer] Second Trimester of Pregnancy The second trimester is from week 13 through week 28, month 4 through 6. This is often the time in pregnancy that you feel your best. Often times, morning sickness has lessened or quit. You may have more energy, and you may get hungry more often. Your unborn baby (fetus) is growing rapidly. At the end of the sixth month, he or she is about 9 inches long and weighs about 1 pounds. You will likely feel the baby move (quickening) between 18 and 20 weeks of pregnancy. HOME CARE     Avoid all smoking, herbs, and alcohol. Avoid drugs not approved by your doctor.  Do not use any tobacco products, including cigarettes, chewing tobacco, and electronic cigarettes. If you need help quitting, ask your doctor. You may get counseling or other support to help you quit.  Only take medicine as told by your doctor. Some medicines are safe and some are not during pregnancy.  Exercise only as told by your doctor. Stop exercising if you start having cramps.  Eat regular, healthy meals.  Wear a good support bra if your breasts are tender.  Do not use hot tubs, steam rooms, or saunas.  Wear your seat belt when driving.  Avoid raw meat, uncooked cheese, and liter boxes and soil used by cats.  Take your prenatal vitamins.  Take 1500-2000 milligrams of calcium daily starting at the 20th week of pregnancy until you deliver your baby.  Try taking medicine that helps you poop (stool softener) as needed, and if your doctor approves. Eat more fiber by eating fresh fruit, vegetables, and whole grains. Drink enough fluids to keep your pee (urine) clear or pale yellow.  Take warm water baths (sitz baths) to soothe pain or discomfort caused by hemorrhoids. Use hemorrhoid cream if your doctor approves.  If you have puffy, bulging veins (varicose veins), wear support hose. Raise (elevate) your feet for 15 minutes, 3-4 times a day. Limit salt in your diet.  Avoid heavy lifting, wear low heals, and sit up straight.  Rest with your legs raised if you have leg cramps or low back pain.  Visit your dentist if you have not gone during your pregnancy. Use a soft toothbrush to brush your teeth. Be gentle when you floss.  You can have sex (intercourse) unless your doctor tells you not to.  Go to your doctor visits. GET HELP IF:   You feel dizzy.  You have mild cramps or pressure in your lower belly (abdomen).  You have a nagging pain in your belly area.  You continue to feel sick to your  stomach (nauseous), throw up (vomit), or have watery poop (diarrhea).  You have bad smelling fluid coming from your vagina.  You have pain with peeing (urination). GET HELP RIGHT AWAY IF:   You have a fever.  You are leaking fluid from your vagina.  You have spotting or bleeding from your vagina.  You have severe belly cramping or pain.  You lose or gain weight rapidly.  You have trouble catching your breath and have chest pain.  You notice sudden or extreme puffiness (swelling) of your face, hands, ankles, feet, or legs.  You have not felt the baby move in over an hour.  You have severe headaches that do not go away with medicine.  You have vision changes.   This information is not intended to replace advice given to you by your health care provider. Make sure you discuss any questions you have with your health care provider.   Document Released: 05/24/2009 Document Revised: 03/20/2014 Document Reviewed: 04/30/2012 Elsevier Interactive Patient Education [employer] Elsevier Inc.

## 2015-11-01 NOTE — MAU Provider Note (Signed)
History     CSN: [id number]  Arrival date and time: 11/01/15 1344   First Provider Initiated Contact with Patient 11/01/15 1616      Chief Complaint  Patient presents with  . Back Pain   HPI  Bonnie Cooper is 20 y.o. G1P0 3677w6d weeks presenting with left sided back pain that began late last night.  Intermittent pain X 5 sec.  Rating at its worse at 10/10 and at present 4/10.  Moving and twisting increases the pain.  Sitting and resting helps.  Does lift boxes at work and her partner's 195 month old.  Has not taken anything for pain.  Denies UTI sxs, N&V, chest pain,contractions, vaginal bleeding.  + for fetal movement. Patient of [facility], last seen last week. Normal visit.  Has Sickle cell trait.      Past Medical History:  Diagnosis Date  . Anxiety   . Chlamydia   . Depression    just recently, no meds. or tx  . Headache   . Sickle cell trait [facility](HCC)     Past Surgical History:  Procedure Laterality Date  . HERNIA REPAIR    . navel  2001    History reviewed. No pertinent family history.  Social History  Substance Use Topics  . Smoking status: Never Smoker  . Smokeless tobacco: Not on file  . Alcohol use No     Comment: 1 year ago    Allergies: No Known Allergies  Prescriptions Prior to Admission  Medication Sig Dispense Refill Last Dose  . Prenatal Vit-Fe Fumarate-FA (MULTIVITAMIN-PRENATAL) 27-0.8 MG TABS tablet Take 1 tablet by mouth daily at 12 noon.   11/01/2015 at Unknown time  . acetaminophen (TYLENOL) 325 MG tablet Take 650 mg by mouth every 6 (six) hours as needed for moderate pain.   Not Taking at Unknown time  . terconazole (TERAZOL 3) 0.8 % vaginal cream Place 1 applicator vaginally at bedtime. (Patient not taking: Reported on 08/23/2015) 20 g 0 Not Taking at Unknown time    Review of Systems  Constitutional: Negative for chills and fever.  Gastrointestinal: Negative for abdominal pain, nausea and vomiting.  Genitourinary: Negative for dysuria, flank pain,  frequency, hematuria and urgency.       Negative for vaginal discharge or bleeding.  Musculoskeletal: Positive for back pain.       Paraspinal lumbar tenderness on the left.  Neg for bilaterally flank pain.   Neurological: Negative for dizziness, tingling and headaches.   Physical Exam   Blood pressure 104/76, pulse 108, temperature 98.5 F (36.9 C), temperature source Oral, resp. rate 16, last menstrual period 05/11/2015.  Physical Exam  Nursing note and vitals reviewed. Constitutional: She is oriented to person, place, and time. She appears well-developed and well-nourished. No distress.  HENT:  Head: Normocephalic.  Neck: Normal range of motion.  Cardiovascular: Normal rate.   Respiratory: Effort normal.  GI: Soft. She exhibits no distension and no mass. There is no tenderness. There is no rebound, no guarding and no CVA tenderness.  Genitourinary: There is no rash, tenderness or lesion on the right labia. There is no rash, tenderness or lesion on the left labia. Uterus is enlarged (measuring [redacted] weeks gestation.). Uterus is not tender. Cervix exhibits no motion tenderness, no discharge and no friability. No tenderness or bleeding in the vagina. Vaginal discharge (moderate amount of yellowish discharge.) found.  Genitourinary Comments: Cervical exam-closed posterior.  Neg for blood or fluid.   Neurological: She is alert and oriented to person,  place, and time.  Skin: Skin is warm and dry.  Psychiatric: She has a normal mood and affect. Her behavior is normal. Thought content normal.    FNR by doppler 140 bpm.  FMS- Results for orders placed or performed during the hospital encounter of 11/01/15 (from the past 24 hour(s))  Urinalysis, Routine w reflex microscopic (not at [facility])     Status: Abnormal   Collection Time: 11/01/15  2:51 PM  Result Value Ref Range   Color, Urine YELLOW YELLOW   APPearance CLEAR CLEAR   Specific Gravity, Urine 1.015 1.005 - 1.030   pH 6.0 5.0 - 8.0    Glucose, UA NEGATIVE NEGATIVE mg/dL   Hgb urine dipstick NEGATIVE NEGATIVE   Bilirubin Urine NEGATIVE NEGATIVE   Ketones, ur NEGATIVE NEGATIVE mg/dL   Protein, ur NEGATIVE NEGATIVE mg/dL   Nitrite NEGATIVE NEGATIVE   Leukocytes, UA SMALL (A) NEGATIVE  Urine microscopic-add on     Status: Abnormal   Collection Time: 11/01/15  2:51 PM  Result Value Ref Range   Squamous Epithelial / LPF 6-30 (A) NONE SEEN   WBC, UA 0-5 0 - 5 WBC/hpf   RBC / HPF NONE SEEN 0 - 5 RBC/hpf   Bacteria, UA RARE (A) NONE SEEN  Wet prep, genital     Status: Abnormal   Collection Time: 11/01/15  5:22 PM  Result Value Ref Range   Yeast Wet Prep HPF POC NONE SEEN NONE SEEN   Trich, Wet Prep NONE SEEN NONE SEEN   Clue Cells Wet Prep HPF POC PRESENT (A) NONE SEEN   WBC, Wet Prep HPF POC MANY (A) NONE SEEN   Sperm NONE SEEN    MAU Course  Procedures  [facility] did not repeat today.  MDM MSE Labs Exam Rx for Flagyl   Assessment and Plan  A:  Lower back in pain in second trimester pregnancy      Bacterial vaginosis  P: Avoid heavy lifting/straining     Tylenol prn back pain      Rx. Flagyl X 1week      [facility]   Nicodemus Denk Cen,EVE M 11/01/2015, 6:03 PM

## 2016-01-17 LAB — OB RESULTS CONSOLE GBS: GBS: NEGATIVE

## 2016-02-09 ENCOUNTER — Inpatient Hospital Stay (HOSPITAL_COMMUNITY): Payer: Medicaid Other | Admitting: Anesthesiology

## 2016-02-09 ENCOUNTER — Inpatient Hospital Stay (HOSPITAL_COMMUNITY)
Admission: AD | Admit: 2016-02-09 | Discharge: 2016-02-11 | DRG: 775 | Disposition: A | Discharge status: Home / Self Care | Payer: Medicaid Other | Source: Ambulatory Visit | Attending: Obstetrics & Gynecology | Admitting: Obstetrics & Gynecology

## 2016-02-09 ENCOUNTER — Encounter (HOSPITAL_COMMUNITY): Payer: Self-pay

## 2016-02-09 DIAGNOSIS — Z3493 Encounter for supervision of normal pregnancy, unspecified, third trimester: Secondary | ICD-10-CM | POA: Diagnosis present

## 2016-02-09 DIAGNOSIS — O9902 Anemia complicating childbirth: Secondary | ICD-10-CM

## 2016-02-09 DIAGNOSIS — D573 Sickle-cell trait: Secondary | ICD-10-CM

## 2016-02-09 DIAGNOSIS — Z3A39 39 weeks gestation of pregnancy: Secondary | ICD-10-CM

## 2016-02-09 DIAGNOSIS — O479 False labor, unspecified: Secondary | ICD-10-CM | POA: Diagnosis present

## 2016-02-09 DIAGNOSIS — O2243 Hemorrhoids in pregnancy, third trimester: Secondary | ICD-10-CM

## 2016-02-09 LAB — TYPE AND SCREEN
ABO/RH(D): O POS
ANTIBODY SCREEN: NEGATIVE

## 2016-02-09 LAB — CBC
HEMATOCRIT: 31.6 % — AB (ref 36.0–46.0)
Hemoglobin: 11.2 g/dL — ABNORMAL LOW (ref 12.0–15.0)
MCH: 34.8 pg — AB (ref 26.0–34.0)
MCHC: 35.4 g/dL (ref 30.0–36.0)
MCV: 98.1 fL (ref 78.0–100.0)
Platelets: 180 10*3/uL (ref 150–400)
RBC: 3.22 MIL/uL — ABNORMAL LOW (ref 3.87–5.11)
RDW: 13.7 % (ref 11.5–15.5)
WBC: 7.7 10*3/uL (ref 4.0–10.5)

## 2016-02-09 MED ORDER — FENTANYL CITRATE (PF) 100 MCG/2ML IJ SOLN: 50.0000 ug | INTRAMUSCULAR | Status: DC | PRN

## 2016-02-09 MED ORDER — ACETAMINOPHEN 325 MG PO TABS: 650.0000 mg | ORAL_TABLET | ORAL | Status: DC | PRN

## 2016-02-09 MED ORDER — METHYLERGONOVINE MALEATE 0.2 MG PO TABS: 0.2000 mg | ORAL_TABLET | ORAL | Status: DC | PRN

## 2016-02-09 MED ORDER — MEASLES, MUMPS & RUBELLA VAC ~~LOC~~ INJ
0.5000 mL | INJECTION | Freq: Once | SUBCUTANEOUS | Status: DC
  Filled 2016-02-09: qty 0.5

## 2016-02-09 MED ORDER — TETANUS-DIPHTH-ACELL PERTUSSIS 5-2.5-18.5 LF-MCG/0.5 IM SUSP
0.5000 mL | Freq: Once | INTRAMUSCULAR | Status: AC
  Administered 2016-02-10: 0.5 mL via INTRAMUSCULAR

## 2016-02-09 MED ORDER — LACTATED RINGERS IV SOLN
500.0000 mL | Freq: Once | INTRAVENOUS | Status: AC
  Administered 2016-02-09: 500 mL via INTRAVENOUS

## 2016-02-09 MED ORDER — COCONUT OIL OIL
1.0000 "application " | TOPICAL_OIL | Status: DC | PRN
  Administered 2016-02-10: 1 via TOPICAL
  Filled 2016-02-09: qty 120

## 2016-02-09 MED ORDER — DIPHENHYDRAMINE HCL 25 MG PO CAPS: 25.0000 mg | ORAL_CAPSULE | Freq: Four times a day (QID) | ORAL | Status: DC | PRN

## 2016-02-09 MED ORDER — PHENYLEPHRINE 40 MCG/ML (10ML) SYRINGE FOR IV PUSH (FOR BLOOD PRESSURE SUPPORT)
80.0000 ug | PREFILLED_SYRINGE | INTRAVENOUS | Status: DC | PRN
  Filled 2016-02-09: qty 5

## 2016-02-09 MED ORDER — FENTANYL CITRATE (PF) 100 MCG/2ML IJ SOLN
50.0000 ug | INTRAMUSCULAR | Status: DC | PRN
  Administered 2016-02-09: 50 ug via INTRAVENOUS
  Filled 2016-02-09: qty 2

## 2016-02-09 MED ORDER — FENTANYL 2.5 MCG/ML BUPIVACAINE 1/10 % EPIDURAL INFUSION (WH - ANES): 14.0000 mL/h | INTRAMUSCULAR | Status: DC | PRN

## 2016-02-09 MED ORDER — OXYTOCIN BOLUS FROM INFUSION
500.0000 mL | Freq: Once | INTRAVENOUS | Status: AC
  Administered 2016-02-09: 500 mL via INTRAVENOUS

## 2016-02-09 MED ORDER — OXYTOCIN 40 UNITS IN LACTATED RINGERS INFUSION - SIMPLE MED
2.5000 [IU]/h | INTRAVENOUS | Status: DC
  Filled 2016-02-09: qty 1000

## 2016-02-09 MED ORDER — OXYCODONE-ACETAMINOPHEN 5-325 MG PO TABS: 2.0000 | ORAL_TABLET | ORAL | Status: DC | PRN

## 2016-02-09 MED ORDER — SENNOSIDES-DOCUSATE SODIUM 8.6-50 MG PO TABS
2.0000 | ORAL_TABLET | ORAL | Status: DC
  Administered 2016-02-10 (×2): 2 via ORAL
  Filled 2016-02-09 (×2): qty 2

## 2016-02-09 MED ORDER — WITCH HAZEL-GLYCERIN EX PADS
1.0000 "application " | MEDICATED_PAD | CUTANEOUS | Status: DC | PRN
  Administered 2016-02-09: 1 via TOPICAL

## 2016-02-09 MED ORDER — SIMETHICONE 80 MG PO CHEW: 80.0000 mg | CHEWABLE_TABLET | ORAL | Status: DC | PRN

## 2016-02-09 MED ORDER — EPHEDRINE 5 MG/ML INJ
10.0000 mg | INTRAVENOUS | Status: DC | PRN
  Filled 2016-02-09: qty 4

## 2016-02-09 MED ORDER — FLEET ENEMA 7-19 GM/118ML RE ENEM: 1.0000 | ENEMA | Freq: Every day | RECTAL | Status: DC | PRN

## 2016-02-09 MED ORDER — BENZOCAINE-MENTHOL 20-0.5 % EX AERO
1.0000 "application " | INHALATION_SPRAY | CUTANEOUS | Status: DC | PRN
  Administered 2016-02-09 – 2016-02-11 (×2): 1 via TOPICAL
  Filled 2016-02-09: qty 56

## 2016-02-09 MED ORDER — ONDANSETRON HCL 4 MG/2ML IJ SOLN: 4.0000 mg | INTRAMUSCULAR | Status: DC | PRN

## 2016-02-09 MED ORDER — ZOLPIDEM TARTRATE 5 MG PO TABS: 5.0000 mg | ORAL_TABLET | Freq: Every evening | ORAL | Status: DC | PRN

## 2016-02-09 MED ORDER — IBUPROFEN 600 MG PO TABS
600.0000 mg | ORAL_TABLET | Freq: Four times a day (QID) | ORAL | Status: DC
  Administered 2016-02-09 – 2016-02-11 (×6): 600 mg via ORAL
  Filled 2016-02-09 (×8): qty 1

## 2016-02-09 MED ORDER — LIDOCAINE HCL (PF) 1 % IJ SOLN
30.0000 mL | INTRAMUSCULAR | Status: DC | PRN
  Filled 2016-02-09: qty 30

## 2016-02-09 MED ORDER — ONDANSETRON HCL 4 MG PO TABS: 4.0000 mg | ORAL_TABLET | ORAL | Status: DC | PRN

## 2016-02-09 MED ORDER — DIPHENHYDRAMINE HCL 50 MG/ML IJ SOLN
12.5000 mg | INTRAMUSCULAR | Status: DC | PRN
  Administered 2016-02-09: 12.5 mg via INTRAVENOUS
  Filled 2016-02-09: qty 1

## 2016-02-09 MED ORDER — ONDANSETRON HCL 4 MG/2ML IJ SOLN
4.0000 mg | Freq: Four times a day (QID) | INTRAMUSCULAR | Status: DC | PRN
  Administered 2016-02-09: 4 mg via INTRAVENOUS
  Filled 2016-02-09: qty 2

## 2016-02-09 MED ORDER — LACTATED RINGERS IV SOLN: 500.0000 mL | INTRAVENOUS | Status: DC | PRN

## 2016-02-09 MED ORDER — PHENYLEPHRINE 40 MCG/ML (10ML) SYRINGE FOR IV PUSH (FOR BLOOD PRESSURE SUPPORT)
80.0000 ug | PREFILLED_SYRINGE | INTRAVENOUS | Status: DC | PRN
  Filled 2016-02-09: qty 5
  Filled 2016-02-09: qty 10

## 2016-02-09 MED ORDER — METHYLERGONOVINE MALEATE 0.2 MG/ML IJ SOLN: 0.2000 mg | INTRAMUSCULAR | Status: DC | PRN

## 2016-02-09 MED ORDER — SOD CITRATE-CITRIC ACID 500-334 MG/5ML PO SOLN: 30.0000 mL | ORAL | Status: DC | PRN

## 2016-02-09 MED ORDER — LACTATED RINGERS IV SOLN
INTRAVENOUS | Status: DC
  Administered 2016-02-09: 125 mL/h via INTRAVENOUS
  Administered 2016-02-09: 10:00:00 via INTRAVENOUS

## 2016-02-09 MED ORDER — DIBUCAINE 1 % RE OINT
1.0000 "application " | TOPICAL_OINTMENT | RECTAL | Status: DC | PRN
  Administered 2016-02-09: 1 via RECTAL
  Filled 2016-02-09: qty 28

## 2016-02-09 MED ORDER — OXYCODONE-ACETAMINOPHEN 5-325 MG PO TABS: 1.0000 | ORAL_TABLET | ORAL | Status: DC | PRN

## 2016-02-09 MED ORDER — LIDOCAINE HCL (PF) 1 % IJ SOLN
INTRAMUSCULAR | Status: DC | PRN
  Administered 2016-02-09 (×2): 4 mL

## 2016-02-09 MED ORDER — PRENATAL MULTIVITAMIN CH
1.0000 | ORAL_TABLET | Freq: Every day | ORAL | Status: DC
  Administered 2016-02-10: 1 via ORAL
  Filled 2016-02-09: qty 1

## 2016-02-09 MED ORDER — FENTANYL 2.5 MCG/ML BUPIVACAINE 1/10 % EPIDURAL INFUSION (WH - ANES)
14.0000 mL/h | INTRAMUSCULAR | Status: DC | PRN
  Administered 2016-02-09 (×2): 14 mL/h via EPIDURAL
  Filled 2016-02-09 (×2): qty 100

## 2016-02-09 NOTE — Anesthesia Procedure Notes (Signed)
Epidural Patient location during procedure: OB  Staffing Anesthesiologist: Ottis Vacha Performed: anesthesiologist   Preanesthetic Checklist Completed: patient identified, pre-op evaluation, timeout performed, IV checked, risks and benefits discussed and monitors and equipment checked  Epidural Patient position: sitting Prep: site prepped and draped and DuraPrep Patient monitoring: heart rate Approach: midline Location: L3-L4 Injection technique: LOR air and LOR saline  Needle:  Needle type: Tuohy  Needle gauge: 17 G Needle length: 9 cm Needle insertion depth: 6 cm Catheter type: closed end flexible Catheter size: 19 Gauge Catheter at skin depth: 12 cm Test dose: negative  Assessment Sensory level: T8 Events: blood not aspirated, injection not painful, no injection resistance, negative IV test and no paresthesia  Additional Notes Reason for block:procedure for pain     

## 2016-02-09 NOTE — Anesthesia Postprocedure Evaluation (Signed)
Anesthesia Post Note  Patient: Bonnie Cooper  Procedure(s) Performed: * No procedures listed *  Patient location during evaluation: Mother Baby Anesthesia Type: Epidural Level of consciousness: awake Pain management: pain level controlled Vital Signs Assessment: post-procedure vital signs reviewed and stable Respiratory status: spontaneous breathing Cardiovascular status: stable Postop Assessment: no headache, no backache, epidural receding, patient able to bend at knees, no signs of nausea or vomiting and adequate PO intake Anesthetic complications: no     Last Vitals:  Vitals:   02/09/16 1331 02/09/16 1400  BP: 119/83 123/78  Pulse: 81 78  Resp: 20 20  Temp:  37.3 C    Last Pain:  Vitals:   02/09/16 1400  TempSrc: Oral  PainSc: 1    Pain Goal:                 Matina Rodier

## 2016-02-09 NOTE — Progress Notes (Signed)
Labor Progress Note  Bonnie Cooper is a 20 y.o. G1P0 at 7659w1d admitted for SOL.   S: Patient comfortable with epidural.    O:  BP 114/77   Pulse 82   Temp 98.4 F (36.9 C) (Oral)   Resp 19   Ht 5\' 3"  (1.6 m)   Wt 73 kg (161 lb)   LMP 05/11/2015   SpO2 100%   BMI 28.52 kg/m   No intake/output data recorded.  FHT:  FHR: 130 bpm, variability: moderate,  accelerations:  Present,  decelerations:  Absent UC:   regular, every 3-4 minutes SVE:   Dilation: 6 Effacement (%): 90 Station: -1, 0 Exam by:: Sampson GoonFitzgerald, MD  SROM @ ~0600: clear  Labs: Lab Results  Component Value Date   WBC 7.7 02/09/2016   HGB 11.2 (L) 02/09/2016   HCT 31.6 (L) 02/09/2016   MCV 98.1 02/09/2016   PLT 180 02/09/2016    Assessment / Plan: 20 y.o. G1P0 3859w1d in early labor Augmentation of labor, progressing well  Labor: Progressing normally, s/p AROM Fetal Wellbeing:  Category I Pain Control:  Epidural Anticipated MOD:  NSVD  Expectant management  Dani GobbleHillary Fitzgerald, MD [facility], PGY-2

## 2016-02-09 NOTE — Anesthesia Pain Management Evaluation Note (Signed)
  CRNA Pain Management Visit Note  Patient: Bonnie Cooper, 20 y.o., female  "Hello I am a [facility]. We have an anesthesia team available at all times to provide care throughout the hospital, including epidural management and anesthesia for C-section. I don't know your plan for the delivery whether it a natural birth, water birth, IV sedation, nitrous supplementation, doula or epidural, but we want to meet your pain goals."   1.Was your pain managed to your expectations on prior hospitalizations?   No prior hospitalizations  2.What is your expectation for pain management during this hospitalization?     Epidural  3.How can we help you reach that goal?   Record the patient's initial score and the patient's pain goal.   Pain: 0  Pain Goal: 3 [facility] wants you to be able to say your pain was always managed very well.  Laban EmperorMalinova,Darryll Raju Hristova 02/09/2016

## 2016-02-09 NOTE — MAU Note (Signed)
Pt reports contractions since midnight every 1-2 minutes apart. Denies LOF or vag bleeding. +FM. Was 1cm on last SVE.

## 2016-02-09 NOTE — H&P (Signed)
LABOR ADMISSION HISTORY AND PHYSICAL  Bonnie Cooper is a 20 y.o. female G1P0 with IUP at 7177w1d by LMP and early U/S presenting for SOL, with painful contractions beginning overnight. She reports +FM, + contractions, No LOF, no VB, no blurry vision, headaches or peripheral edema, and RUQ pain.  She plans on breast feeding while in the hospital but may switch to formula. She plans to be abstinent for birth control.  Dating: By LMP --->  Estimated Date of Delivery: 02/15/16  Sono:    Ultrasound not available   Prenatal History/Complications: Anemia Chlaymdia treated at age 20  Past Medical History: Past Medical History:  Diagnosis Date  . Anxiety   . Chlamydia   . Depression    just recently, no meds. or tx  . Headache   . Sickle cell trait [facility](HCC)     Past Surgical History: Past Surgical History:  Procedure Laterality Date  . HERNIA REPAIR    . navel  2001    Obstetrical History: OB History    Gravida Para Term Preterm AB Living   1         0   SAB TAB Ectopic Multiple Live Births                  Social History: Social History   Social History  . Marital status: Single    Spouse name: N/A  . Number of children: N/A  . Years of education: N/A   Social History Main Topics  . Smoking status: Never Smoker  . Smokeless tobacco: None  . Alcohol use No     Comment: 1 year ago  . Drug use:     Types: Marijuana     Comment: a couple of days ago  . Sexual activity: Yes   Other Topics Concern  . None   Social History Narrative  . None    Family History: No family history on file.  Allergies: No Known Allergies  Prescriptions Prior to Admission  Medication Sig Dispense Refill Last Dose  . acetaminophen (TYLENOL) 325 MG tablet Take 650 mg by mouth every 6 (six) hours as needed for moderate pain.   Not Taking at Unknown time  . metroNIDAZOLE (FLAGYL) 500 MG tablet Take 1 tablet (500 mg total) by mouth 2 (two) times daily. 14 tablet 0   . Prenatal Vit-Fe  Fumarate-FA (MULTIVITAMIN-PRENATAL) 27-0.8 MG TABS tablet Take 1 tablet by mouth daily at 12 noon.   11/01/2015 at Unknown time     Review of Systems   All systems reviewed and negative except as stated in HPI  BP 131/80 (BP Location: Right Arm)   Pulse 103   Temp 98.4 F (36.9 C) (Oral)   Resp 18   LMP 05/11/2015   SpO2 100%  General appearance: alert and appears stated age Lungs: clear to auscultation bilaterally Heart: regular rate and rhythm Abdomen: soft, non-tender; bowel sounds normal Extremities: Homans sign is negative, no sign of DVT, edema Presentation: cephalic - confirmed by bedside U/S Fetal monitoringBaseline: 135 bpm, Variability: Good {> 6 bpm), Accelerations: Reactive and Decelerations: Absent Uterine activityFrequency: Every 2-3 minutes Dilation: 3 Effacement (%): 90, 100 Station: -3 Exam by:: Camelia Enganielle Simpson RN   Prenatal labs: ABO, Rh: --/--/O POS (04/06 1258) Antibody:  Negative Rubella: Immune RPR:  Negative HBsAg:  Negative HIV: Non Reactive (04/06 1259)  GBS: Negative (11/06 0000)  1 hr Glucola: 95 Genetic screening: First screen negative, quad screen negative, MSAFP negative Anatomy US: wnl  Prenatal  Transfer Tool  Maternal Diabetes: No Genetic Screening: Normal Maternal Ultrasounds/Referrals: Normal Fetal Ultrasounds or other Referrals:  None Maternal Substance Abuse:  No Significant Maternal Medications:  None Significant Maternal Lab Results: Lab values include: Group B Strep negative, Other: Sickle Cell Trait  Results for orders placed or performed during the hospital encounter of 02/09/16 (from the past 24 hour(s))  CBC   Collection Time: 02/09/16  2:48 AM  Result Value Ref Range   WBC 7.7 4.0 - 10.5 K/uL   RBC 3.22 (L) 3.87 - 5.11 MIL/uL   Hemoglobin 11.2 (L) 12.0 - 15.0 g/dL   HCT 16.131.6 (L) 09.636.0 - 04.546.0 %   MCV 98.1 78.0 - 100.0 fL   MCH 34.8 (H) 26.0 - 34.0 pg   MCHC 35.4 30.0 - 36.0 g/dL   RDW 40.913.7 81.111.5 - 91.415.5 %    Platelets 180 150 - 400 K/uL  Type and screen [facility]   Collection Time: 02/09/16  2:48 AM  Result Value Ref Range   ABO/RH(D) O POS    Antibody Screen NEG    Sample Expiration 02/12/2016     Patient Active Problem List   Diagnosis Date Noted  . Uterine contractions during pregnancy 02/09/2016    Assessment: Bonnie Cooper is a 20 y.o. G1P0 at 7190w1d here for SOL in early labor. With painful contractions and cervical change since last SVE.   #Labor: Expectant management #Pain: Planning on epidural #FWB:  Cat I #ID:  GBS negative #MOF: Breast while in hospital but may switch to formula #MOC: Abstinence #Circ:  Yes (outpatient)  Dani GobbleHillary Fitzgerald, MD [facility], PGY-2   OB FELLOW HISTORY AND PHYSICAL ATTESTATION  I have seen and examined this patient; I agree with above documentation in the resident's note. Patient is likely in early latent labor, however is very uncomfortable during contractions (about every 5 min), and effacement is 90-100%. Will admit and augment labor as needed.   Jen MowElizabeth Kareena Arrambide, DO OB Fellow

## 2016-02-09 NOTE — MAU Note (Signed)
CNM reviewed FHT. Pt okay to receive fentanyl

## 2016-02-09 NOTE — MAU Note (Signed)
Pt transported via wheelchair to room 169

## 2016-02-09 NOTE — Lactation Note (Addendum)
This note was copied from a baby's chart. Lactation Consultation Note  Patient Name: Bonnie Cooper ZOXWR'UToday's Date: 02/09/2016 Reason for consult: Initial assessment   Initial assessment with first time mom of < 1 hour old infant. Infant STS with mom and quietly alert. Mom reports she is willing to try to BF and if it does not work she will give a bottle. She is planning to BF in the hospital to give infant colostrum and then change to formula. She reports + breast changes with pregnancy and [facility] Mom was on her phone during the entire visit.   Mom with large compressible breasts with compressible areolas and everted nipples. Mom reports + breast changes with pregnancy. Showed mom how to hand express, large gtt noted to right breast, glistening to left breast. Mom returned hand expression demo. Enc mom to hand express prior to latch to start milk flow and after Bf to apply to nipples.  Assisted mom in latching infant to right breast, infant on and off the breast. Infant would get into long sucking bursts. After 10 minutes, although infant was still cueing to feed, mom removed him from the breast. Infant was noted to be small in size, weighed and noted to be 6 lb 6 oz. Enc mom to feed infant 8-12 x in 24 hours at first feeding cues. Showed mom how to latch infant using the cross cradle hold, she did use the cross cradle hold to latch infant after taught.   BF Resources Handout given, enc mom to call WIC post d/c to schedule appt. Mom reports she does not have a pump since she is planning to change to bottle feeding. Feeding loog given with instructions for use. Discussed colostrum, milk coming to volume, infant stomach size and nutritional needs. [facility] Bro[facility] given, mom informed of IP/OP Services, BF Support Groups and LC phone #. Enc mom to call out to desk for feeding assistance as needed. Follow up tomorrow and prn.    Maternal Data Formula Feeding for Exclusion:  Yes Reason for exclusion: Mother's choice to formula and breast feed on admission (Plans to BF in hospital and change to formula after d/c) Has patient been taught Hand Expression?: Yes Does the patient have breastfeeding experience prior to this delivery?: No  Feeding Feeding Type: Breast Fed Length of feed: 10 min  LATCH Score/Interventions Latch: Repeated attempts needed to sustain latch, nipple held in mouth throughout feeding, stimulation needed to elicit sucking reflex. Intervention(s): Adjust position;Assist with latch;Breast massage;Breast compression  Audible Swallowing: A few with stimulation  Type of Nipple: Everted at rest and after stimulation  Comfort (Breast/Nipple): Soft / non-tender     Hold (Positioning): Assistance needed to correctly position infant at breast and maintain latch. Intervention(s): Breastfeeding basics reviewed;Support Pillows;Position options;Skin to skin  LATCH Score: 7  Lactation Tools Discussed/Used WIC Program: Yes   Consult Status Consult Status: Follow-up Date: 02/10/16 Follow-up type: In-patient    Bonnie Cooper 02/09/2016, 12:54 PM

## 2016-02-09 NOTE — Anesthesia Preprocedure Evaluation (Signed)
Anesthesia Evaluation  Patient identified by MRN, date of birth, ID band Patient awake    Reviewed: Allergy & Precautions, NPO status , Patient's Chart, lab work & pertinent test results  Airway Mallampati: II  TM Distance: >3 FB Neck ROM: Full    Dental no notable dental hx.    Pulmonary neg pulmonary ROS,    Pulmonary exam normal breath sounds clear to auscultation       Cardiovascular negative cardio ROS Normal cardiovascular exam Rhythm:Regular Rate:Normal     Neuro/Psych negative neurological ROS  negative psych ROS   GI/Hepatic negative GI ROS, Neg liver ROS,   Endo/Other  negative endocrine ROS  Renal/GU negative Renal ROS     Musculoskeletal negative musculoskeletal ROS (+)   Abdominal   Peds  Hematology negative hematology ROS (+)   Anesthesia Other Findings   Reproductive/Obstetrics (+) Pregnancy                             Anesthesia Physical Anesthesia Plan  ASA: II  Anesthesia Plan: Epidural   Post-op Pain Management:    Induction:   Airway Management Planned:   Additional Equipment:   Intra-op Plan:   Post-operative Plan:   Informed Consent: I have reviewed the patients History and Physical, chart, labs and discussed the procedure including the risks, benefits and alternatives for the proposed anesthesia with the patient or authorized representative who has indicated his/her understanding and acceptance.     Plan Discussed with:   Anesthesia Plan Comments:         Anesthesia Quick Evaluation  

## 2016-02-10 LAB — RPR: RPR: NONREACTIVE

## 2016-02-10 NOTE — Clinical Social Work Maternal (Signed)
CLINICAL SOCIAL WORK MATERNAL/CHILD NOTE  Patient Details  Name: Bonnie Cooper MRN: [id number] Date of Birth: 03/22/1995  Date:  02/10/2016  Clinical Social Worker Initiating Note:  Laurey Arrow Date/ Time Initiated:  02/10/16/1048     Child's Name:  Sherri Sear   Legal Guardian:  Mother   Need for Interpreter:  None   Date of Referral:  02/10/16     Reason for Referral:  Current Substance Use/Substance Use During Pregnancy    Referral Source:  Central Nursery   Address:  [address] [place] [address]  Phone number:  [phone]   Household Members:  Self, Soil scientist, Minor Children   Natural Supports (not living in the home):  Parent (MOB's grandmother)   Professional Supports: None (Information was provided for the [employer] progra.)   Employment: Animator   Type of Work: Field seismologist Resources:  [employer]   Other Resources:  [employer], Physicist, medical    Cultural/Religious Considerations Which May Impact Care:  None Reported  Strengths:  Ability to meet basic needs , Engineer, materials , Home prepared for child    Risk Factors/Current Problems:  Mental Health Concerns , Substance Use    Cognitive State:  Alert , Able to Concentrate , Insightful , Linear Thinking    Mood/Affect:  Bright , Happy , Interested    CSW Assessment: CSW met with MOB to complete a consult for substance abuse hx and hx of anxiety/depression.  MOB was inviting and polite.  MOB gave CSW permission to meet with MOB while MOB's partner, Gaetana Michaelis was present. MOB reported that MOB is currently in a same sex relationship with Gaetana Michaelis, but FOB is Atilano Ina. CSW inquired about MOB's substance use hx, and MOB acknowledged the use of marijuana prior to MOB's pregnancy confirmation. MOB reported MOB's last [place] of marijuana was 9 months ago.  CSW thanked MOB for [employer] and informed MOB of the  hospital's drug screen policy.  CSW made MOB aware of the 2 screenings for the infant. CSW made MOB aware that the infant's UDS was negative and CSW will continue to monitor the infant's cord screen.  CSW informed MOB that if the cord screen is positive without an explanation, CSW would make a report to [facility] CPS. MOB denied any prior history of CPS, and was understanding of the hospital's policy. CSW offered MOB resources for SA treatment and MOB declined the information.  CSW inquired about MOB's MH hx and MOB acknowledged a hx of depression during pregnancy.  MOB reported feelings of sadness, uncontrollable crying, and lost of interest in activities. MOB denied a medication regiment and being involved with outpatient counseling. CSW educated MOB about PPD.  CSW informed MOB of possible supports and interventions to decrease PPD.  CSW also encouraged MOB to seek medical attention if needed for increased signs, symptoms for PPD. CSW offered MOB resources for outpatient MH counseling, and MOB declined, however, MOB was receptive to information for enrolling in the Healthy Start Program. CSW reviewed safe sleep and SIDS. MOB was knowledgeable and asked appropriate questions. MOB communicated that she has a bassinet for the baby, and has read information regarding SIDS. CSW thanked MOB for her willingness to meet with CSW.  MOB did not have any further questions, concerns, or needs at this time.     CSW Plan/Description:  Patient/Family Education , No Further Intervention Required/No Barriers to Discharge, Information/Referral to  Community Resources  (CSW will follow infant's cord and will make a report to [facility] if warranted. )   Laurey Arrow, MSW, LCSW Clinical Social Work [phone]    Dimple Nanas, LCSW 02/10/2016, 10:52 AM

## 2016-02-10 NOTE — Progress Notes (Signed)
POSTPARTUM PROGRESS NOTE  Post Partum Day 1 Subjective:  Bonnie Cooper is a 20 y.o. G1P1001 5844w1d s/p SVD.  No acute events overnight.  Pt denies problems with ambulating, voiding or po intake.  She denies nausea or vomiting.  Pain is well controlled.  She has had flatus. She has not had bowel movement.  Lochia Small.   Objective: Blood pressure (!) 98/59, pulse 69, temperature 97.7 F (36.5 C), temperature source Oral, resp. rate 18, height 5\' 3"  (1.6 m), weight 161 lb (73 kg), last menstrual period 05/11/2015, SpO2 100 %, unknown if currently breastfeeding.  Physical Exam:  General: alert, cooperative and no distress Lochia:normal flow Chest: CTAB Heart: RRR no m/r/g Abdomen: +BS, soft, nontender,  Uterine Fundus: firm, intact DVT Evaluation: No calf swelling or tenderness Extremities: no edema   Recent Labs  02/09/16 0248  HGB 11.2*  HCT 31.6*    Assessment/Plan:  ASSESSMENT: Bonnie Cooper is a 20 y.o. G1P1001 2444w1d s/p SVD  Plan for discharge tomorrow, Breastfeeding and Contraception abstinence, circumcision outpatient   LOS: 1 day   Berniece SalinesWALLACE, NOAH I, DO PGY-3 [facility]  02/10/2016, 7:57 AM    OB FELLOW POSTPARTUM PROGRESS NOTE ATTESTATION  I have seen and examined this patient and agree with above documentation in the resident's note.   Jen MowElizabeth Long Brimage, DO OB Fellow

## 2016-02-10 NOTE — Progress Notes (Signed)
Post Partum Day 1 Subjective: no complaints, up ad lib, voiding, tolerating PO and has not had a bowel movement or passed flatus due to fear of pain. She has had intermittent rectal pain since delivery. She has felt the urge to stool and pass flatus. Overall she reports doing very well.  Objective: Blood pressure (!) 98/59, pulse 69, temperature 97.7 F (36.5 C), temperature source Oral, resp. rate 18, height 5\' 3"  (1.6 m), weight 73 kg (161 lb), last menstrual period 05/11/2015, SpO2 100 %, unknown if currently breastfeeding.  Physical Exam:  General: alert, cooperative, appears stated age and no distress Lochia: appropriate Uterine Fundus: firm Incision: n/a DVT Evaluation: No evidence of DVT seen on physical exam.   Recent Labs  02/09/16 0248  HGB 11.2*  HCT 31.6*    Assessment/Plan: Bonnie Cooper is a 20 yo 591P1001 F with no significant history who is currently post partum day 1 after a SVD  Plan for discharge tomorrow, Breastfeeding, Lactation consult and Contraception abstinence.   Vaccines - Give Tdap prior to discharge  Rectal pain, likely from delivery and hemorrhoids  - ice, ibuprofen, cream, and tucks pads   LOS: 1 day   Will Bonnie Cooper 02/10/2016, 7:32 AM   OB FELLOW MEDICAL STUDENT NOTE ATTESTATION  I have seen and examined this patient. Note this is a Psychologist, occupationalmedical student note and as such does not necessarily reflect the patient's plan of care. Please see progress note for this date of service.    Bonnie MowElizabeth Saraiya Kozma, DO [place] Fellow 02/10/2016

## 2016-02-11 MED ORDER — IBUPROFEN 600 MG PO TABS: 600.0000 mg | ORAL_TABLET | Freq: Four times a day (QID) | ORAL | 0 refills | Status: DC

## 2016-02-11 MED ORDER — DIBUCAINE 1 % RE OINT: 1.0000 "application " | TOPICAL_OINTMENT | RECTAL | 1 refills | Status: DC | PRN

## 2016-02-11 NOTE — Discharge Instructions (Signed)

## 2016-02-11 NOTE — Lactation Note (Addendum)
This note was copied from a baby's chart. Lactation Consultation Note  Mother reports her nipples are sore. Skin is intact. Discussed positioning and latch but did not feed the baby because he had just eaten formula. Reviewed engorgement prevention and relief,normal output, and expressing milk to feed the baby.  Understanding verbalized and mother did not have any further questions.  Patient Name: Boy Marcy Salvoirik Mathieu OZHYQ'MToday's Date: 02/11/2016     Maternal Data    Feeding Feeding Type: Bottle Fed - Formula  LATCH Score/Interventions                      Lactation Tools Discussed/Used     Consult Status Consult Status: Complete    Soyla DryerJoseph, Brodyn Depuy 02/11/2016, 8:43 AM

## 2016-02-11 NOTE — Discharge Summary (Signed)
OB Discharge Summary     Patient Name: Bonnie Cooper DOB: [birth year] MRN: [id number]  Date of admission: 02/09/2016 Delivering MD: Donette LarryBHAMBRI, MELANIE   Date of discharge: 02/11/2016  Admitting diagnosis: 39 wks ctx every 1 to 2 min Intrauterine pregnancy: 5041w1d     Secondary diagnosis:  Active Problems:   Uterine contractions during pregnancy  Additional problems: none     Discharge diagnosis: Term Pregnancy Delivered                                                                                                Post partum procedures:none  Augmentation: AROM Complications: None  Hospital course:  Onset of Labor With Vaginal Delivery     20 y.o. yo G1P1001 at 3041w1d was admitted in Active Labor on 02/09/2016. Patient had an uncomplicated labor course as follows:  Membrane Rupture Time/Date: 5:59 AM ,02/09/2016   Intrapartum Procedures: Episiotomy: None [1]                                         Lacerations:  None [1]  Patient had a delivery of a Viable infant. 02/09/2016  Information for the patient's newborn:  Lynford CitizenHogan, Boy Vona [[id number]][[id number]]  Delivery Method: Vaginal, Spontaneous Delivery (Filed from Delivery Summary)    Pateint had an uncomplicated postpartum course.  She is ambulating, tolerating a regular diet, passing flatus, and urinating well. Patient is discharged home in stable condition on 02/11/16.    Physical exam Vitals:   02/10/16 0300 02/10/16 0524 02/10/16 1835 02/11/16 0614  BP: 102/68 (!) 98/59 107/65 110/63  Pulse: 78 69 77 78  Resp: 18 18 18 18   Temp: 97.8 F (36.6 C) 97.7 F (36.5 C) 98.5 F (36.9 C) 98.7 F (37.1 C)  TempSrc: Oral Oral Oral Oral  SpO2: 100%     Weight:      Height:       General: alert, cooperative and no distress Lochia: appropriate Uterine Fundus: firm Incision: N/A DVT Evaluation: No evidence of DVT seen on physical exam. Negative Homan's sign. No cords or calf tenderness. No significant calf/ankle edema. Calf/Ankle  edema is present Labs: Lab Results  Component Value Date   WBC 7.7 02/09/2016   HGB 11.2 (L) 02/09/2016   HCT 31.6 (L) 02/09/2016   MCV 98.1 02/09/2016   PLT 180 02/09/2016   No flowsheet data found.  Discharge instruction: per After Visit Summary and "Baby and Me Booklet".  After visit meds:    Medication List    STOP taking these medications   acetaminophen 325 MG tablet Commonly known as:  TYLENOL   multivitamin-prenatal 27-0.8 MG Tabs tablet     TAKE these medications   dibucaine 1 % Oint Commonly known as:  NUPERCAINAL Place 1 application rectally as needed for hemorrhoids.   ibuprofen 600 MG tablet Commonly known as:  ADVIL,MOTRIN Take 1 tablet (600 mg total) by mouth every 6 (six) hours.       Diet: routine diet  Activity: Advance as tolerated.  Pelvic rest for 6 weeks.   Outpatient follow up:6 weeks Follow up Appt:No future appointments. Follow up Visit:No Follow-up on file.  Postpartum contraception: Abstinence (has a girlfriend)  Newborn Data: Live born female  Birth Weight: 6 lb 6.8 oz (2915 g) APGAR: 6, 8  Baby Feeding: Bottle and Breast Disposition:home with mother   02/11/2016 Greig RightRESENZO-DISHMAN,Vestal Crandall, CNM

## 2016-02-11 NOTE — Plan of Care (Signed)
Problem: Nutritional: Goal: Mothers verbalization of comfort with breastfeeding process will improve Outcome: Completed/Met Date Met: 02/11/16 Pt verbalizes nipple soreness. Pt has coconut oil at the bedside.

## 2016-07-11 IMAGING — US US MFM FETAL NUCHAL TRANSLUCENCY
2 series · 15 of 28 positions shown · non-contrast
Comparison: none

[Series 1: us mfm fetal nuchal translucency · 9 of 36 slices shown (1 of 2)]
[im 1/36]
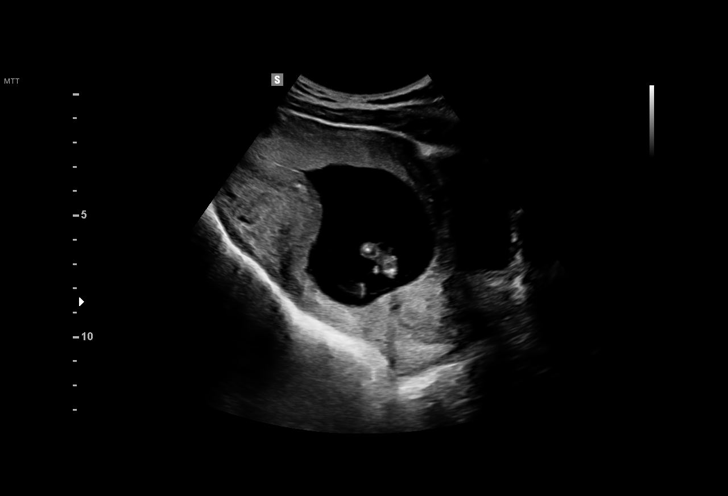
[im 5/36]
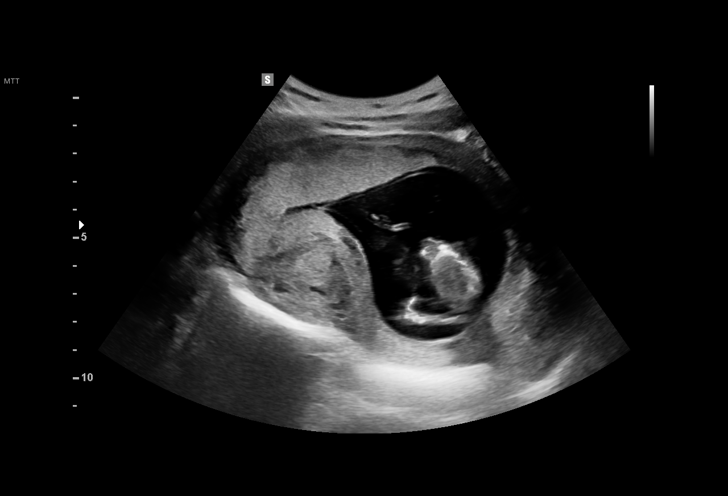
[im 9/36]
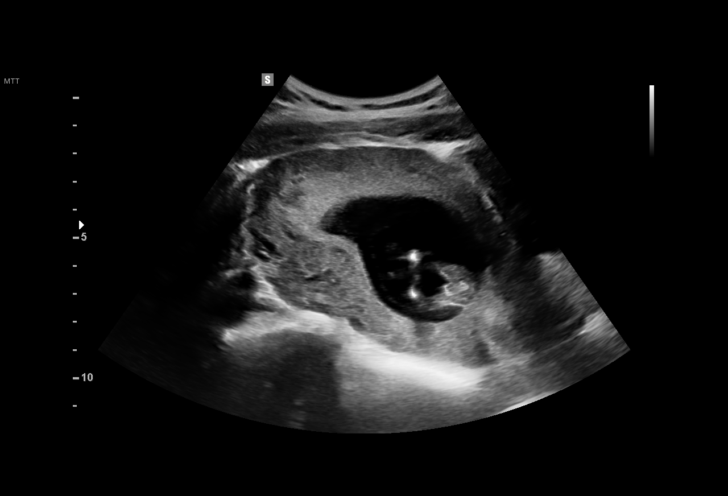
[im 14/36]
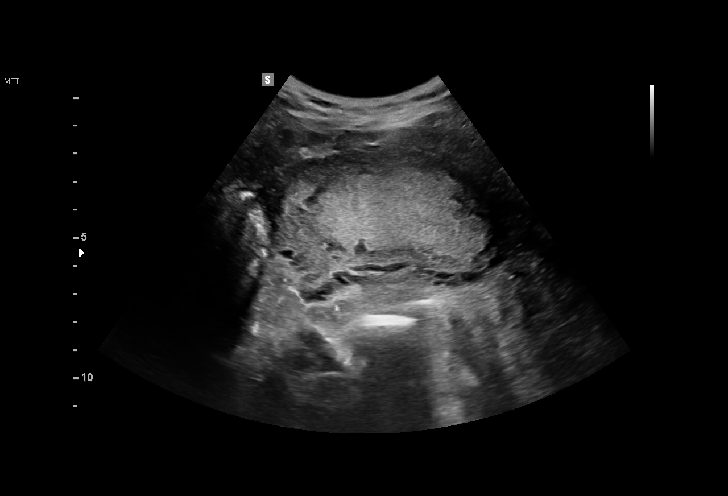
[im 18/36]
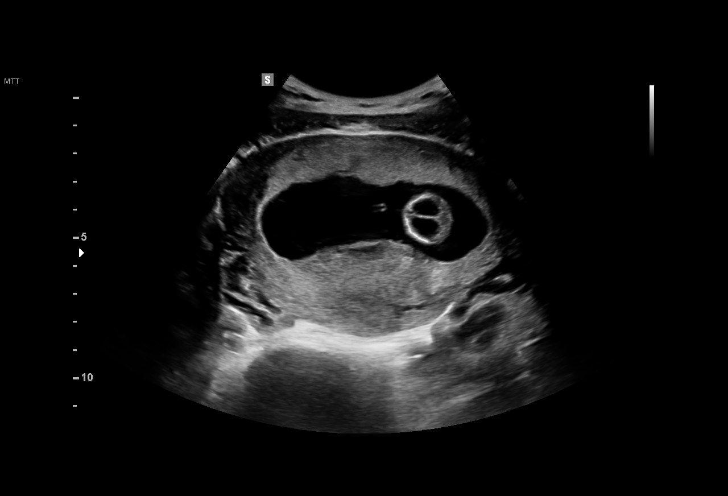
[im 22/36]
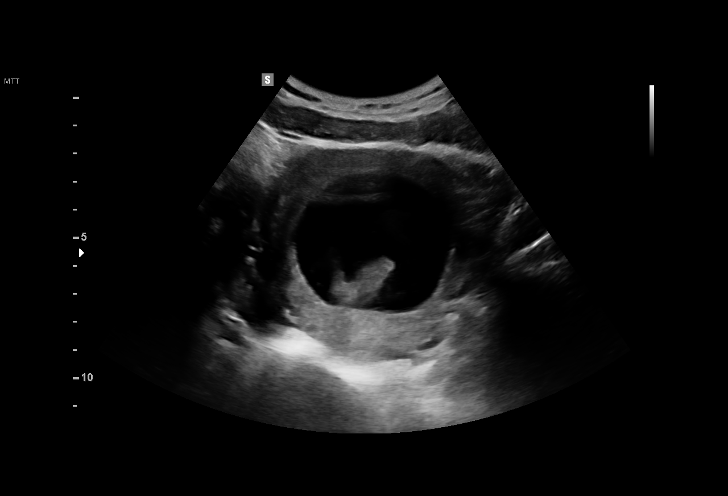
[im 27/36]
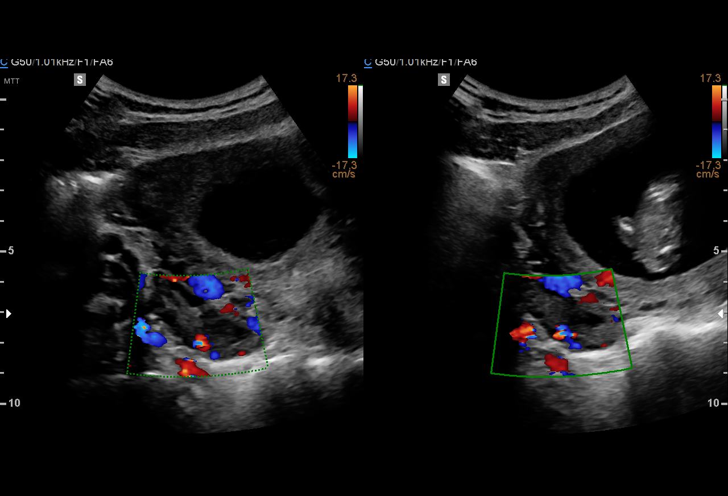
[im 31/36]
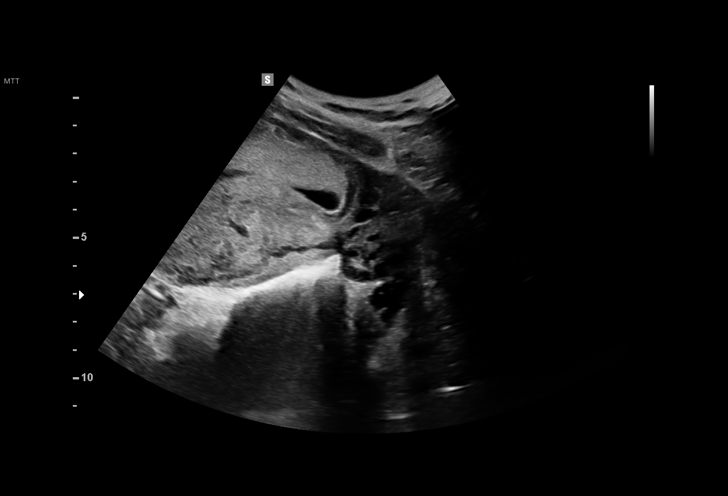
[im 33/36]
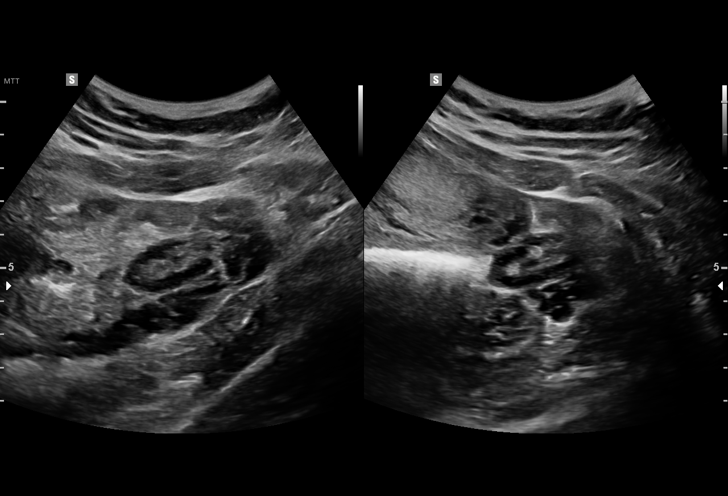

[Series 2: us mfm fetal nuchal translucency · 6 of 25 slices shown (2 of 2)]
[im 1/25]
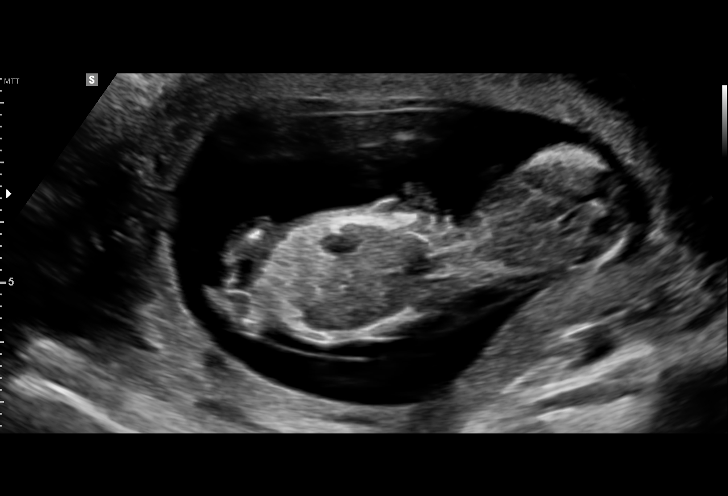
[im 5/25]
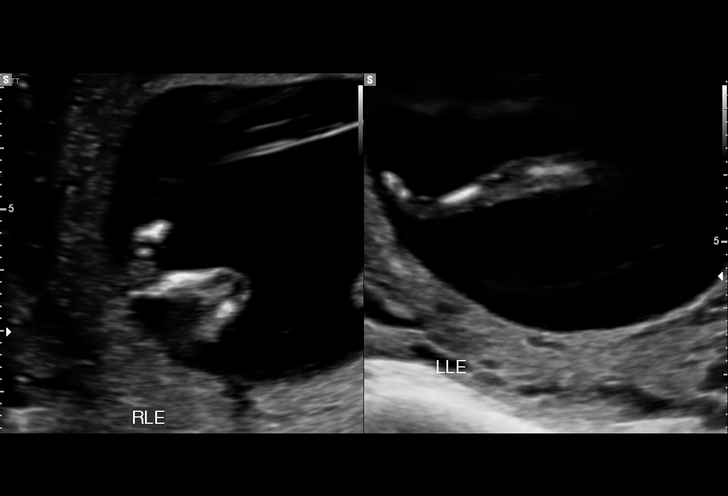
[im 10/25]
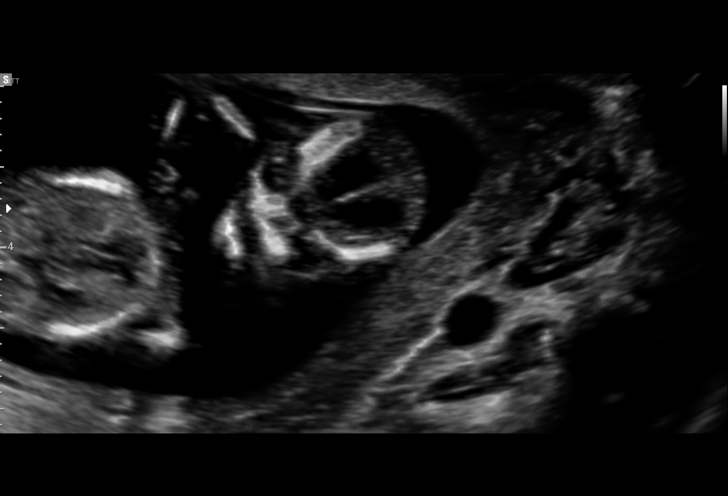
[im 15/25]
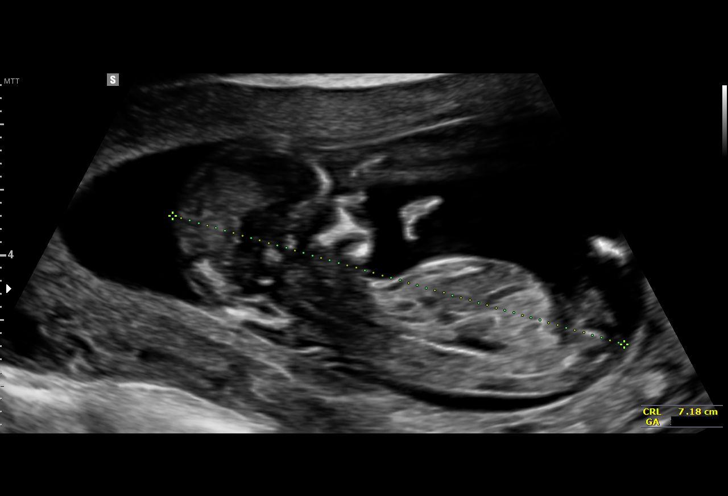
[im 20/25]
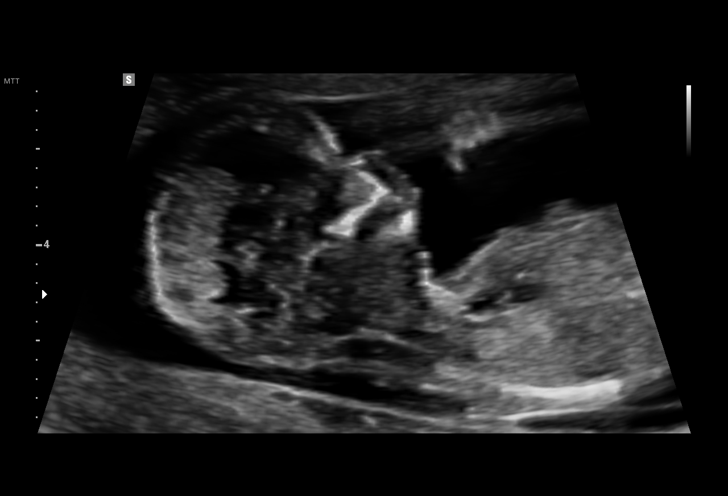
[im 25/25]
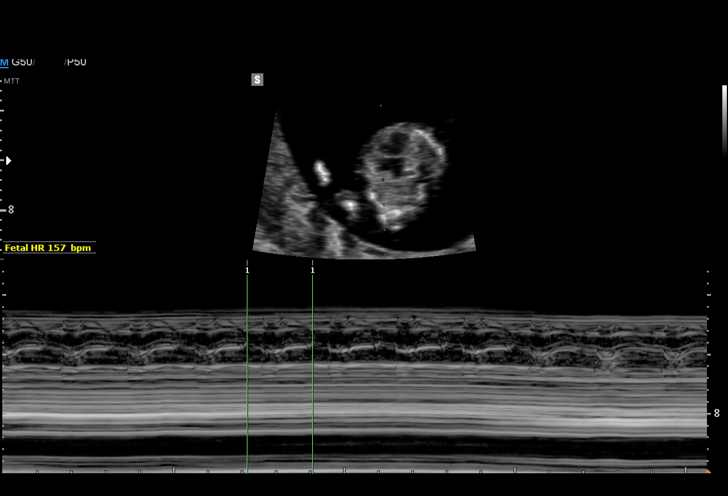

[15 of 28 positions shown; findings below may reference images not displayed]

PERECTIONIST NP
[REDACTED]-
Faculty Physician

TRANSLUCENCY

1  RUDOLPHE HESSE            [id number]      [id number]     [id number]
Indications

13 weeks gestation of pregnancy
First trimester aneuploidy screen (NT)         Z36
History of sickle cell trait
OB History

Blood Type:            Height:  5'3"   Weight (lb):  149      BMI:
Gravidity:    1         Term:   0        Prem:   0        SAB:   0
TOP:          0       Ectopic:  0        Living: 0
Fetal Evaluation

Num Of Fetuses:     1
Fetal Heart         157
Rate(bpm):
Cardiac Activity:   Observed
Presentation:       Variable
Placenta:           Anterior

Amniotic Fluid
AFI FV:      Subjectively within normal limits
Gestational Age
LMP:           13w 1d       Date:   05/11/15                 EDD:   02/15/16
Best:          13w 1d    Det. By:   LMP  (05/11/15)          EDD:   02/15/16
1st Trimester Genetic Sonogram Screening

CRL:            71.1  mm    G. Age:   13w 1d                 EDD:   02/15/16
Nuc Trans:       1.6  mm

Nasal Bone:                 Present
Cervix Uterus Adnexa

Cervix
Normal appearance by transabdominal scan.

Uterus
No abnormality visualized.

Left Ovary
Size(cm)     3.29  x    2.96   x  1.53      Vol(ml):
Within normal limits. No adnexal mass visualized.

Right Ovary
Size(cm)     4.31  x    3.96   x  1.78      Vol(ml):
Within normal limits. No adnexal mass visualized.

Cul De Sac:   No free fluid seen.

Adnexa:       No abnormality visualized.
Impression

SIUP at 13+1 weeks
No gross abnormalities identified
NT measurement was within normal limits for this GA; NB
present
Normal amniotic fluid volume
Measurements consistent with LMP dating
Recommendations

Offer anatomy U/S by 18 weeks
Offer MSAFP in the second trimester for ONTD screening

## 2017-12-24 ENCOUNTER — Other Ambulatory Visit: Payer: Self-pay

## 2017-12-31 LAB — CYTOLOGY - PAP: DIAGNOSIS: NEGATIVE

## 2018-08-06 ENCOUNTER — Ambulatory Visit (INDEPENDENT_AMBULATORY_CARE_PROVIDER_SITE_OTHER): Payer: Self-pay | Admitting: Internal Medicine

## 2018-08-06 ENCOUNTER — Other Ambulatory Visit: Payer: Self-pay

## 2018-08-06 ENCOUNTER — Encounter: Payer: Self-pay | Admitting: Internal Medicine

## 2018-08-06 ENCOUNTER — Ambulatory Visit: Payer: Medicaid Other

## 2018-08-06 VITALS — BP 138/101 | HR 57 | Temp 98.0°F | Ht 63.0 in | Wt 150.7 lb

## 2018-08-06 DIAGNOSIS — R519 Headache, unspecified: Secondary | ICD-10-CM

## 2018-08-06 DIAGNOSIS — D573 Sickle-cell trait: Secondary | ICD-10-CM

## 2018-08-06 DIAGNOSIS — G43009 Migraine without aura, not intractable, without status migrainosus: Secondary | ICD-10-CM | POA: Insufficient documentation

## 2018-08-06 DIAGNOSIS — Z8249 Family history of ischemic heart disease and other diseases of the circulatory system: Secondary | ICD-10-CM

## 2018-08-06 DIAGNOSIS — R51 Headache: Secondary | ICD-10-CM

## 2018-08-06 DIAGNOSIS — R030 Elevated blood-pressure reading, without diagnosis of hypertension: Secondary | ICD-10-CM

## 2018-08-06 NOTE — Assessment & Plan Note (Signed)
Specific headaches, no features of migraine. Normally respond well to Excedrin. She does not drink caffeine on a regular basis.  -Advised her to continue taking Excedrin as needed.

## 2018-08-06 NOTE — Patient Instructions (Addendum)
Thank you for visiting clinic today. I am glad you are doing well. For your headaches continue using Excedrin as needed. These let us know if you develop worsening of your headaches or start getting some associated symptoms of nausea, vomiting, blurry vision or becomes sensitive to light. As we discussed exercise regularly and follow a low-sodium diet. Follow-up in 3 months for your blood pressure check.   DASH Eating Plan DASH stands for "Dietary Approaches to Stop Hypertension." The DASH eating plan is a healthy eating plan that has been shown to reduce high blood pressure (hypertension). It may also reduce your risk for type 2 diabetes, heart disease, and stroke. The DASH eating plan may also help with weight loss. What are tips for following this plan?  General guidelines  Avoid eating more than 2,300 mg (milligrams) of salt (sodium) a day. If you have hypertension, you may need to reduce your sodium intake to 1,500 mg a day.  Limit alcohol intake to no more than 1 drink a day for nonpregnant women and 2 drinks a day for men. One drink equals 12 oz of beer, 5 oz of wine, or 1 oz of hard liquor.  Work with your health care provider to maintain a healthy body weight or to lose weight. Ask what an ideal weight is for you.  Get at least 30 minutes of exercise that causes your heart to beat faster (aerobic exercise) most days of the week. Activities may include walking, swimming, or biking.  Work with your health care provider or diet and nutrition specialist (dietitian) to adjust your eating plan to your individual calorie needs. Reading food labels   Check food labels for the amount of sodium per serving. Choose foods with less than 5 percent of the Daily Value of sodium. Generally, foods with less than 300 mg of sodium per serving fit into this eating plan.  To find whole grains, look for the word "whole" as the first word in the ingredient list. Shopping  Buy products labeled as  "low-sodium" or "no salt added."  Buy fresh foods. Avoid canned foods and premade or frozen meals. Cooking  Avoid adding salt when cooking. Use salt-free seasonings or herbs instead of table salt or sea salt. Check with your health care provider or pharmacist before using salt substitutes.  Do not fry foods. Cook foods using healthy methods such as baking, boiling, grilling, and broiling instead.  Cook with heart-healthy oils, such as olive, canola, soybean, or sunflower oil. Meal planning  Eat a balanced diet that includes: ? 5 or more servings of fruits and vegetables each day. At each meal, try to fill half of your plate with fruits and vegetables. ? Up to 6-8 servings of whole grains each day. ? Less than 6 oz of lean meat, poultry, or fish each day. A 3-oz serving of meat is about the same size as a deck of cards. One egg equals 1 oz. ? 2 servings of low-fat dairy each day. ? A serving of nuts, seeds, or beans 5 times each week. ? Heart-healthy fats. Healthy fats called Omega-3 fatty acids are found in foods such as flaxseeds and coldwater fish, like sardines, salmon, and mackerel.  Limit how much you eat of the following: ? Canned or prepackaged foods. ? Food that is high in trans fat, such as fried foods. ? Food that is high in saturated fat, such as fatty meat. ? Sweets, desserts, sugary drinks, and other foods with added sugar. ? Full-fat dairy  products.  Do not salt foods before eating.  Try to eat at least 2 vegetarian meals each week.  Eat more home-cooked food and less restaurant, buffet, and fast food.  When eating at a restaurant, ask that your food be prepared with less salt or no salt, if possible. What foods are recommended? The items listed may not be a complete list. Talk with your dietitian about what dietary choices are best for you. Grains Whole-grain or whole-wheat bread. Whole-grain or whole-wheat pasta. Brown rice. Orpah Cobb. Bulgur. Whole-grain  and low-sodium cereals. Pita bread. Low-fat, low-sodium crackers. Whole-wheat flour tortillas. Vegetables Fresh or frozen vegetables (raw, steamed, roasted, or grilled). Low-sodium or reduced-sodium tomato and vegetable juice. Low-sodium or reduced-sodium tomato sauce and tomato paste. Low-sodium or reduced-sodium canned vegetables. Fruits All fresh, dried, or frozen fruit. Canned fruit in natural juice (without added sugar). Meat and other protein foods Skinless chicken or [place]. Ground chicken or [place]. Pork with fat trimmed off. Fish and seafood. Egg whites. Dried beans, peas, or lentils. Unsalted nuts, nut butters, and seeds. Unsalted canned beans. Lean cuts of beef with fat trimmed off. Low-sodium, lean deli meat. Dairy Low-fat (1%) or fat-free (skim) milk. Fat-free, low-fat, or reduced-fat cheeses. Nonfat, low-sodium ricotta or cottage cheese. Low-fat or nonfat yogurt. Low-fat, low-sodium cheese. Fats and oils Soft margarine without trans fats. Vegetable oil. Low-fat, reduced-fat, or light mayonnaise and salad dressings (reduced-sodium). Canola, safflower, olive, soybean, and sunflower oils. Avocado. Seasoning and other foods Herbs. Spices. Seasoning mixes without salt. Unsalted popcorn and pretzels. Fat-free sweets. What foods are not recommended? The items listed may not be a complete list. Talk with your dietitian about what dietary choices are best for you. Grains Baked goods made with fat, such as croissants, muffins, or some breads. Dry pasta or rice meal packs. Vegetables Creamed or fried vegetables. Vegetables in a cheese sauce. Regular canned vegetables (not low-sodium or reduced-sodium). Regular canned tomato sauce and paste (not low-sodium or reduced-sodium). Regular tomato and vegetable juice (not low-sodium or reduced-sodium). Rosita Fire. Olives. Fruits Canned fruit in a light or heavy syrup. Fried fruit. Fruit in cream or butter sauce. Meat and other protein foods Fatty cuts  of meat. Ribs. Fried meat. Tomasa Blase. Sausage. Bologna and other processed lunch meats. Salami. Fatback. Hotdogs. Bratwurst. Salted nuts and seeds. Canned beans with added salt. Canned or smoked fish. Whole eggs or egg yolks. Chicken or [place] with skin. Dairy Whole or 2% milk, cream, and half-and-half. Whole or full-fat cream cheese. Whole-fat or sweetened yogurt. Full-fat cheese. Nondairy creamers. Whipped toppings. Processed cheese and cheese spreads. Fats and oils Butter. Stick margarine. Lard. Shortening. Ghee. Bacon fat. Tropical oils, such as coconut, palm kernel, or palm oil. Seasoning and other foods Salted popcorn and pretzels. Onion salt, garlic salt, seasoned salt, table salt, and sea salt. Worcestershire sauce. Tartar sauce. Barbecue sauce. Teriyaki sauce. Soy sauce, including reduced-sodium. Steak sauce. Canned and packaged gravies. Fish sauce. Oyster sauce. Cocktail sauce. Horseradish that you find on the shelf. Ketchup. Mustard. Meat flavorings and tenderizers. Bouillon cubes. Hot sauce and Tabasco sauce. Premade or packaged marinades. Premade or packaged taco seasonings. Relishes. Regular salad dressings. Where to find more information:  National Heart, Lung, and Blood Institute: PopSteam.is  American Heart Association: www.heart.org Summary  The DASH eating plan is a healthy eating plan that has been shown to reduce high blood pressure (hypertension). It may also reduce your risk for type 2 diabetes, heart disease, and stroke.  With the DASH eating plan, you should limit  salt (sodium) intake to 2,300 mg a day. If you have hypertension, you may need to reduce your sodium intake to 1,500 mg a day.  When on the DASH eating plan, aim to eat more fresh fruits and vegetables, whole grains, lean proteins, low-fat dairy, and heart-healthy fats.  Work with your health care provider or diet and nutrition specialist (dietitian) to adjust your eating plan to your individual calorie  needs. This information is not intended to replace advice given to you by your health care provider. Make sure you discuss any questions you have with your health care provider. Document Released: 02/16/2011 Document Revised: 02/21/2016 Document Reviewed: 02/21/2016 Elsevier Interactive Patient Education  2019 [employer]

## 2018-08-06 NOTE — Assessment & Plan Note (Signed)
Was having mildly elevated blood pressure. No prior history of hypertension.  Per patient her blood pressure remained normal during pregnancy. Multiple family members with hypertension.  -Advised her regarding lifestyle modifications which includes regular exercise and low-sodium diet. -Ask her to keep checking her blood pressure at home and bring a log with her next appointment in 65-month for blood pressure check.

## 2018-08-06 NOTE — Progress Notes (Signed)
   CC: To establish care.  HPI:  [name] is a 23 y.o. with past medical history only significant for sickle cell trait and headache/migraine came to our clinic to establish care.  Patient's get headaches 3-4 times a month.  No associated features of photophobia, aura, nausea, vomiting or blurry vision.  Her headache responded well to Excedrin.  No relationship with her menstrual cycle.  Her LMP was 08/01/18. Per patient she is up-to-date on vaccination and had her Pap smear done in October 2019.  She has no other complaints.  Past Medical History:  Diagnosis Date  . Anxiety   . Chlamydia   . Depression    just recently, no meds. or tx  . Headache   . Sickle cell trait (HCC)    Family history.  Multiple family members with diabetes and hypertension.  Full history.  Works in Cabin crew at [employer].  Lives with her husband and a 61-year-old son.  Non-smoker.  Denies any alcohol or illicit drug use.  Review of Systems: Negative except mentioned in HPI.  Physical Exam:  Vitals:   08/06/18 1359  BP: (!) 130/98  Pulse: 62  Temp: 98 F (36.7 C)  TempSrc: Oral  SpO2: 100%  Weight: 150 lb 11.2 oz (68.4 kg)  Height: 5\' 3"  (1.6 m)   General: Vital signs reviewed.  Patient is well-developed and well-nourished, in no acute distress and cooperative with exam.  Head: Normocephalic and atraumatic. Eyes: EOMI, conjunctivae normal, no scleral icterus.  Neck: Supple, trachea midline, normal ROM, no JVD, thyromegaly, or carotid bruit present.  Cardiovascular: RRR, S1 normal, S2 normal, no murmurs, gallops, or rubs. Pulmonary/Chest: Clear to auscultation bilaterally, no wheezes, rales, or rhonchi. Abdominal: Soft, non-tender, non-distended, BS +,  Extremities: No lower extremity edema bilaterally,  pulses symmetric and intact bilaterally. No cyanosis or clubbing. Neurological: A&O x3, Strength is normal and symmetric bilaterally, cranial nerve II-XII are grossly intact, no  focal motor deficit, sensory intact to light touch bilaterally.  Skin: Warm, dry and intact. No rashes or erythema. Psychiatric: Normal mood and affect. speech and behavior is normal. Cognition and memory are normal.  Assessment & Plan:   See Encounters Tab for problem based charting.  Patient discussed with Dr. Rogelia Boga.

## 2018-08-07 NOTE — Progress Notes (Signed)
Internal Medicine Clinic Attending  Case discussed with Dr. Amin at the time of the visit.  We reviewed the resident's history and exam and pertinent patient test results.  I agree with the assessment, diagnosis, and plan of care documented in the resident's note.    

## 2018-09-25 ENCOUNTER — Encounter: Payer: Self-pay | Admitting: Internal Medicine

## 2018-09-25 ENCOUNTER — Ambulatory Visit: Payer: Self-pay | Admitting: Internal Medicine

## 2018-09-25 ENCOUNTER — Other Ambulatory Visit: Payer: Self-pay

## 2018-09-25 VITALS — BP 121/86 | HR 69 | Temp 98.8°F | Ht 63.0 in | Wt 147.1 lb

## 2018-09-25 DIAGNOSIS — K0889 Other specified disorders of teeth and supporting structures: Secondary | ICD-10-CM | POA: Insufficient documentation

## 2018-09-25 DIAGNOSIS — D573 Sickle-cell trait: Secondary | ICD-10-CM

## 2018-09-25 DIAGNOSIS — Z8659 Personal history of other mental and behavioral disorders: Secondary | ICD-10-CM

## 2018-09-25 DIAGNOSIS — R030 Elevated blood-pressure reading, without diagnosis of hypertension: Secondary | ICD-10-CM

## 2018-09-25 HISTORY — DX: Other specified disorders of teeth and supporting structures: K08.89

## 2018-09-25 NOTE — Patient Instructions (Signed)
Ms. Longbottom,  It was a pleasure seeing you in clinic today. We discussed your dental pain.  - Referral has been placed for the dentist. Please make an appointment  - Referral has been placed to Ms. [name]. Please make an appointment to see her for your depression.   Thank you!

## 2018-09-25 NOTE — Assessment & Plan Note (Signed)
BP 121/86. Patient reports home BP up to 140/98. She reports history of depression and has increased stress at home. Patient expresses interest in talking to Ms. [name].   - Referral to Behavioral health

## 2018-09-25 NOTE — Assessment & Plan Note (Signed)
Patient presents with left sided lower wisdom tooth pain that is intermittent and is relieved with patient biting down on wet paper towel. On exam, no dental abscesses, cavities or dental caries noted. Normal dentition without any oral lesions. No lymphadenopathy appreciated.  - Referral to dentistry

## 2018-09-25 NOTE — Progress Notes (Signed)
   CC: left wisdom tooth pain   HPI:  Bonnie Cooper is a 23 y.o. female w/Hx of sickle cell trait presenting to clinic w/left lower wisdom tooth pain for one month duration. She reports pain is intermittent and relieved with biting down on a wet paper towel. She denies any radiation of the pain. She has not tried any pain medications. She denies fever, chills, or recent dental infection.    Past Medical History:  Diagnosis Date  . Anxiety   . Chlamydia   . Depression    just recently, no meds. or tx  . Headache   . Sickle cell trait ([place])    Review of Systems:  Review of Systems  Constitutional: Negative for chills and fever.  HENT: Negative for congestion, ear pain, hearing loss, sinus pain and sore throat.   Respiratory: Negative for cough and shortness of breath.   Neurological: Negative for dizziness, sensory change, focal weakness and headaches.     Physical Exam:  There were no vitals filed for this visit. Physical Exam  Constitutional: She is oriented to person, place, and time and well-developed, well-nourished, and in no distress.  HENT:  Head: Normocephalic and atraumatic.  Mouth/Throat: Oropharynx is clear and moist and mucous membranes are normal. No oral lesions. Normal dentition. No dental abscesses or dental caries.  Eyes: Conjunctivae are normal.  Neck: Normal range of motion.  Cardiovascular: Normal rate, regular rhythm, normal heart sounds and intact distal pulses. Exam reveals no gallop and no friction rub.  No murmur heard. Pulmonary/Chest: Effort normal and breath sounds normal. No respiratory distress. She has no wheezes. She has no rales.  Musculoskeletal: Normal range of motion.  Lymphadenopathy:    She has no cervical adenopathy.  Neurological: She is alert and oriented to person, place, and time.  Skin: Skin is warm and dry.     Assessment & Plan:   See Encounters Tab for problem based charting.  Patient seen with Dr. Angelia Mould

## 2018-09-26 NOTE — Progress Notes (Signed)
Internal Medicine Clinic Attending  I saw and evaluated the patient.  I personally confirmed the key portions of the history and exam documented by Dr. Aslam and I reviewed pertinent patient test results.  The assessment, diagnosis, and plan were formulated together and I agree with the documentation in the resident's note.     

## 2018-10-02 ENCOUNTER — Telehealth: Payer: Self-pay | Admitting: Licensed Clinical Social Worker

## 2018-10-02 ENCOUNTER — Encounter: Payer: Self-pay | Admitting: Licensed Clinical Social Worker

## 2018-10-02 NOTE — Telephone Encounter (Signed)
Patient was called due to a referral from her PCP (1st attempt). Pt did not answer, and a vm was left for the patient to contact our office. A letter will also be mailed today.

## 2018-10-08 ENCOUNTER — Other Ambulatory Visit: Payer: Self-pay

## 2018-10-08 ENCOUNTER — Ambulatory Visit (INDEPENDENT_AMBULATORY_CARE_PROVIDER_SITE_OTHER): Payer: Self-pay | Admitting: Licensed Clinical Social Worker

## 2018-10-08 DIAGNOSIS — F321 Major depressive disorder, single episode, moderate: Secondary | ICD-10-CM
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
## 2018-10-22 ENCOUNTER — Other Ambulatory Visit: Payer: Self-pay

## 2018-10-22 ENCOUNTER — Encounter: Payer: Self-pay | Admitting: Licensed Clinical Social Worker

## 2018-10-22 ENCOUNTER — Ambulatory Visit (INDEPENDENT_AMBULATORY_CARE_PROVIDER_SITE_OTHER): Payer: Self-pay | Admitting: Licensed Clinical Social Worker

## 2018-10-22 DIAGNOSIS — F321 Major depressive disorder, single episode, moderate: Secondary | ICD-10-CM

## 2018-10-22 NOTE — BH Specialist Note (Signed)
Integrated Behavioral Health Follow Up Visit  MRN: [id number] Name: Novali Vollman  Number of [facility] Clinician visits: 2/6 Session Start time: 2:23  Session End time: 3:00 Total time: 37 minutes  Type of Service: Integrated Behavioral Health- Individual Interpretor:No. I  SUBJECTIVE: Kambri Dismore is a 23 y.o. female  whom attended individually. Patient was referred by Dr. Marva Panda for depression. Patient reports the following symptoms/concerns:  Duration of problem: two years; Severity of problem: moderate  OBJECTIVE: Mood: Depressed and Hopeless and Affect: Constricted Risk of harm to self or others: No plan to harm self or others  LIFE CONTEXT: Family and Social: Patient lives with her partner (currently broken up) and her son. Patient reported she struggles with their break up, and she does not want to face that her partner will be moving out in the next month or so.  School/Work: No issues with work. Self-Care: Patient will isolate and sleep as a way to cope with her depression. Patient is not eating regularly.   GOALS ADDRESSED: Patient will: 1.  Reduce symptoms of: agitation, anxiety, depression, mood instability and stress  2.  Increase knowledge and/or ability of: coping skills, healthy habits and stress reduction  3.  Demonstrate ability to: Increase healthy adjustment to current life circumstances and Increase adequate support systems for patient/family  INTERVENTIONS: Interventions utilized:  Behavioral Activation, Brief CBT, Supportive Counseling and Sleep Hygiene Standardized Assessments completed: assessed for SI, HI, and self-harm.  ASSESSMENT: Patient currently experiencing ongoing depression. Patient continues to struggle with thoughts of being a failure, poor appetite, and sleeping too much. Patient is describing mood instability, and reported that she knows what mood she will be in as soon as she wakes up in the morning. Patient is currently  denying any behaviors or symptoms that would be consistent with a manic episode. It appears, per self-report, that her mood shifts between depressed to irritability. Patient reported she was having a "good day" today, and these only occur around two days a week. Patient does not share her thoughts/feelings through words or by non-verbals. Patient continues to have limited social interactions with others.  Patient identified the following goals: 1. Gain control over her emotions. Work on anger issues. 2. Incorporate balance in relationships.   Patient may benefit from outpatient therapy.  PLAN: 1. Follow up with behavioral health clinician on : two weeks.   [name], [facility], [place]

## 2018-10-25 NOTE — Addendum Note (Signed)
Addended by: Hulan Fray on: 10/25/2018 02:47 PM   Modules accepted: Orders

## 2018-10-29 ENCOUNTER — Ambulatory Visit: Payer: Self-pay | Admitting: Internal Medicine

## 2018-10-29 ENCOUNTER — Other Ambulatory Visit (HOSPITAL_COMMUNITY)
Admission: RE | Admit: 2018-10-29 | Discharge: 2018-10-29 | Disposition: A | Discharge status: Home / Self Care | Payer: Medicaid Other | Source: Ambulatory Visit | Attending: Student in an Organized Health Care Education/Training Program | Admitting: Student in an Organized Health Care Education/Training Program

## 2018-10-29 ENCOUNTER — Other Ambulatory Visit: Payer: Self-pay

## 2018-10-29 ENCOUNTER — Encounter: Payer: Self-pay | Admitting: Internal Medicine

## 2018-10-29 VITALS — BP 128/98 | HR 78 | Temp 98.6°F | Ht 63.0 in | Wt 149.5 lb

## 2018-10-29 DIAGNOSIS — I10 Essential (primary) hypertension: Secondary | ICD-10-CM

## 2018-10-29 DIAGNOSIS — N898 Other specified noninflammatory disorders of vagina: Secondary | ICD-10-CM | POA: Diagnosis present

## 2018-10-29 NOTE — Progress Notes (Signed)
   CC: Vaginal discharge  HPI:  [name] is a 23 y.o. very pleasant African-American woman presenting for evaluation of vaginal discharge.  Please see problem based charting for further details  Past Medical History:  Diagnosis Date  . Anxiety   . Chlamydia   . Depression    just recently, no meds. or tx  . Headache   . Sickle cell trait ([place])    Review of Systems: As per HPI  Physical Exam:  Vitals:   10/29/18 1342  BP: (!) 128/98  Pulse: 78  Temp: 98.6 F (37 C)  TempSrc: Oral  SpO2: 100%  Weight: 149 lb 8 oz (67.8 kg)  Height: 5\' 3"  (1.6 m)   Physical Exam Vitals signs and nursing note reviewed. Exam conducted with a chaperone present.  Constitutional:      General: She is not in acute distress.    Appearance: Normal appearance. She is normal weight. She is not ill-appearing.  Genitourinary:    General: Normal vulva.     Pubic Area: No rash.      Labia:        Right: No rash, tenderness or lesion.        Left: No rash, tenderness or lesion.      Vagina: No foreign body. Vaginal discharge (Milky) present. No tenderness, bleeding, lesions or prolapsed vaginal walls.     Cervix: Discharge (milky) present. No cervical motion tenderness, erythema or eversion.     Comments: Performed by Dr. Koleen Distance Neurological:     Mental Status: She is alert.     Assessment & Plan:   See Encounters Tab for problem based charting.  Patient discussed with Dr. Evette Doffing

## 2018-10-29 NOTE — Patient Instructions (Signed)
Ms. Rieth,  It was a pleasure taking care of your the clinic today.  Regarding the discharge you are having we are going to get samples and send it to the lab.  I will call you with the results once I get them.  Please continue to take care of yourself.  Take Care! Dr. Eileen Stanford  Please call the internal medicine center clinic if you have any questions or concerns, we may be able to help and keep you from a long and expensive emergency room wait. Our clinic and after hours phone number is [phone], the best time to call is Monday through Friday 9 am to 4 pm but there is always someone available 24/7 if you have an emergency. If you need medication refills please notify your pharmacy one week in advance and they will send [place] a request.

## 2018-10-29 NOTE — Assessment & Plan Note (Signed)
Vaginal discharge: Bonnie Cooper states she was in her usual health until yesterday when she noticed a milky vaginal discharge with associated urinary frequency stating she has to use the bathroom about every 5 minutes.  She denies dysuria, urinary urgency, abdominal pain.  She states she is sexually active with one partner but is unsure about his partner's sexual lifestyle or history.  She states her last menstrual period was October 09, 2018 and does not believe she is pregnant.  She was uncomfortable for having a female performed vaginal exam so Dr. Koleen Distance gladly volunteered to perform vaginal exam with wet mount and cervical swab.  She was noted to have copious milky vaginal discharge however no evidence of cervical motion tenderness.  Her point-of-care urine dipstick was unremarkable.  Differential diagnosis includes bacterial vaginosis, candidal vaginitis, gonorrhea or chlamydia.  Plan: -Follow-up wet mount with GC/chlamydia, BV, trichomonas and candida -Follow-up HIV, RPR

## 2018-10-29 NOTE — Progress Notes (Deleted)
   CC: ***  HPI:  [name] is a 23 y.o.   Past Medical History:  Diagnosis Date  . Anxiety   . Chlamydia   . Depression    just recently, no meds. or tx  . Headache   . Sickle cell trait ([place])    Review of Systems:  ***  Physical Exam:  Vitals:   10/29/18 1342  BP: (!) 128/98  Pulse: 78  Temp: 98.6 F (37 C)  TempSrc: Oral  SpO2: 100%  Weight: 149 lb 8 oz (67.8 kg)  Height: 5\' 3"  (1.6 m)   ***  Assessment & Plan:   See Encounters Tab for problem based charting.  Patient {GC/GE:3044014::"discussed with","seen with"} Dr. {NAMES:3044014::"Butcher","Granfortuna","E. Hoffman","Mullen","Narendra","Raines","Vincent"}

## 2018-10-30 LAB — HIV ANTIBODY (ROUTINE TESTING W REFLEX): HIV Screen 4th Generation wRfx: NONREACTIVE

## 2018-10-30 LAB — URINALYSIS, ROUTINE W REFLEX MICROSCOPIC
Bilirubin, UA: NEGATIVE
Glucose, UA: NEGATIVE
Ketones, UA: NEGATIVE
Leukocytes,UA: NEGATIVE
Nitrite, UA: NEGATIVE
Protein,UA: NEGATIVE
RBC, UA: NEGATIVE
Specific Gravity, UA: 1.018 (ref 1.005–1.030)
Urobilinogen, Ur: 1 mg/dL (ref 0.2–1.0)
pH, UA: 6.5 (ref 5.0–7.5)

## 2018-10-30 LAB — RPR: RPR Ser Ql: NONREACTIVE

## 2018-10-30 NOTE — Progress Notes (Signed)
Internal Medicine Clinic Attending  Case discussed with Dr. Agyei at the time of the visit.  We reviewed the resident's history and exam and pertinent patient test results.  I agree with the assessment, diagnosis, and plan of care documented in the resident's note.    

## 2018-10-31 ENCOUNTER — Other Ambulatory Visit: Payer: Self-pay | Admitting: Internal Medicine

## 2018-10-31 ENCOUNTER — Telehealth: Payer: Self-pay | Admitting: Internal Medicine

## 2018-10-31 DIAGNOSIS — N898 Other specified noninflammatory disorders of vagina: Secondary | ICD-10-CM

## 2018-10-31 LAB — CERVICOVAGINAL ANCILLARY ONLY
Bacterial vaginitis: POSITIVE — AB
Candida vaginitis: NEGATIVE
Chlamydia: NEGATIVE
Neisseria Gonorrhea: NEGATIVE
Trichomonas: NEGATIVE

## 2018-10-31 LAB — SPECIMEN STATUS REPORT

## 2018-10-31 LAB — PREGNANCY, URINE: Preg Test, Ur: NEGATIVE

## 2018-10-31 MED ORDER — METRONIDAZOLE 500 MG PO TABS: 500.0000 mg | ORAL_TABLET | Freq: Two times a day (BID) | ORAL | 0 refills | Status: DC

## 2018-10-31 MED ORDER — METRONIDAZOLE 500 MG PO TABS: 500.0000 mg | ORAL_TABLET | Freq: Two times a day (BID) | ORAL | 0 refills | Status: AC

## 2018-10-31 NOTE — Telephone Encounter (Signed)
Medication Refill-Per patient please send request to the [facility].  metroNIDAZOLE (FLAGYL) 500 MG tablet

## 2018-10-31 NOTE — Telephone Encounter (Signed)
Ok. Thanks!

## 2018-10-31 NOTE — Progress Notes (Signed)
Called Bonnie Cooper to discuss results.  Her cervical wet mount and Gram stain was positive for a bacteria vaginitis and will treat with Flagyl 500 mg twice daily for 7 days.

## 2018-11-05 ENCOUNTER — Ambulatory Visit (INDEPENDENT_AMBULATORY_CARE_PROVIDER_SITE_OTHER): Payer: Self-pay | Admitting: Licensed Clinical Social Worker

## 2018-11-05 ENCOUNTER — Other Ambulatory Visit: Payer: Self-pay

## 2018-11-05 DIAGNOSIS — F321 Major depressive disorder, single episode, moderate: Secondary | ICD-10-CM

## 2018-11-05 NOTE — BH Specialist Note (Signed)
Integrated Behavioral Health Follow Up Visit  MRN: [id number] Name: Bonnie Cooper  Number of [place] Clinician visits: 3/6 Session Start time: 10:35  Session End time: 11;05 Total time: 30 minutes  Type of Service: Integrated Behavioral Health- Individual Interpretor:No.  SUBJECTIVE: Bonnie Cooper is a 23 y.o. female  whom attended the session individually.  Patient was referred by [facility] for depression. Patient reports the following symptoms/concerns: sleeping too much, decreased appetite, depression, and lack of natural supports.  Duration of problem: two years; Severity of problem: moderate  OBJECTIVE: Mood: Depressed and Affect: Constricted Risk of harm to self or others: No plan to harm self or others  LIFE CONTEXT: Family and Social: Patient is continuing to have her ex-partner live with her. Patient reported that her ex-partner is currently pregnant by another person, and this was not agreed upon between the two of them while they were dating.  School/Work: Continuing to work. Patient forces herself to go to work most days due to lack of energy. Self-Care: Sleep hygiene and healthy eating habits need improvement.  Life Changes: Patient's ex-partner is planning to have a baby in the next week, and they currently all live in the same residence.   GOALS ADDRESSED: Patient will: 1.  Reduce symptoms of: agitation, anxiety, depression and stress  2.  Increase knowledge and/or ability of: coping skills, healthy habits, self-management skills and stress reduction  3.  Demonstrate ability to: Increase healthy adjustment to current life circumstances and Increase adequate support systems for patient/family  INTERVENTIONS: Interventions utilized:  Motivational Interviewing, Behavioral Activation, Brief CBT, Supportive Counseling and Sleep Hygiene Standardized Assessments completed: assessed for SI, HI, and self-harm.  ASSESSMENT: Patient currently experiencing  ongoing moderate levels of depression. Patient processed recent triggers. Patient has struggled with her 5-year relationship ending, and not being wanted by her ex-partner. Patient denies any thoughts of hurting herself, but she is struggling with hopelessness daily. Patient is only eating once a day, and has been losing weight. Patient reported she has no appetite, and does not force herself to have a healthy eating routine. Patient is sleeping most of the day when she is not at work. Patient is not engaging in activities that bring her joy. Patient is open to talking to her PCP about possible medications to address her depression. Patient will be scheduling a future appointment with her PCP.   Patient may benefit from outpatient therapy.  PLAN: 1. Follow up with behavioral health clinician on : one week.   [name], [facility], [place]

## 2018-11-06 ENCOUNTER — Encounter: Payer: Self-pay | Admitting: Licensed Clinical Social Worker

## 2018-11-19 ENCOUNTER — Encounter: Payer: Self-pay | Admitting: Licensed Clinical Social Worker

## 2018-11-19 ENCOUNTER — Ambulatory Visit (INDEPENDENT_AMBULATORY_CARE_PROVIDER_SITE_OTHER): Payer: Self-pay | Admitting: Internal Medicine

## 2018-11-19 ENCOUNTER — Other Ambulatory Visit: Payer: Self-pay

## 2018-11-19 ENCOUNTER — Encounter: Payer: Self-pay | Admitting: Internal Medicine

## 2018-11-19 VITALS — BP 130/97 | HR 63 | Temp 98.4°F | Ht 63.0 in | Wt 146.0 lb

## 2018-11-19 DIAGNOSIS — N898 Other specified noninflammatory disorders of vagina: Secondary | ICD-10-CM

## 2018-11-19 DIAGNOSIS — R030 Elevated blood-pressure reading, without diagnosis of hypertension: Secondary | ICD-10-CM

## 2018-11-19 DIAGNOSIS — Z8742 Personal history of other diseases of the female genital tract: Secondary | ICD-10-CM

## 2018-11-19 DIAGNOSIS — Z79899 Other long term (current) drug therapy: Secondary | ICD-10-CM

## 2018-11-19 DIAGNOSIS — F321 Major depressive disorder, single episode, moderate: Secondary | ICD-10-CM

## 2018-11-19 DIAGNOSIS — F329 Major depressive disorder, single episode, unspecified: Secondary | ICD-10-CM

## 2018-11-19 DIAGNOSIS — R519 Headache, unspecified: Secondary | ICD-10-CM

## 2018-11-19 DIAGNOSIS — R51 Headache: Secondary | ICD-10-CM

## 2018-11-19 DIAGNOSIS — F32A Depression, unspecified: Secondary | ICD-10-CM | POA: Insufficient documentation

## 2018-11-19 MED ORDER — FLUOXETINE HCL 20 MG PO CAPS: 20.0000 mg | ORAL_CAPSULE | Freq: Every day | ORAL | 4 refills | Status: DC

## 2018-11-19 NOTE — Assessment & Plan Note (Signed)
Patient reports frequent headaches that she describes as migraines that have been an ongoing issue for many years.  Patient thinks that these are worse now as she has not been eating as much secondary to depression.  Patient is unable to pinpoint frequency but thinks they may be occurring about once per week.  Patient has associated nausea with episodes but is without photophobia.  They are relieved with 2 doses of extra strength Excedrin. * Patient instructed to keep a headache diary and bring this to the next visit * We will reevaluate symptoms once adequate control of depression is acquired

## 2018-11-19 NOTE — Assessment & Plan Note (Signed)
Patient with elevated blood pressure of 134/97 today, at last clinic visit reading of 128/98.  Patient states that she limits salt intake and cooks all of her own food.  Patient denies frequent alcohol usage, denies smoking cigarettes, denies recreational drug usage.  Patient reports family history of hypertension.  Patient without chest pain or shortness of breath.  Patient exceeds diastolic pressure threshold at which antihypertensive therapy can be considered.  However, given patient's depression/anxiety which may be contributory to condition, we will defer consideration of initiation of agent until next visit. * Patient instructed to continue to limit salt intake, exercise, and record blood pressures at home and record these values.

## 2018-11-19 NOTE — Assessment & Plan Note (Signed)
Patient was given course of metronidazole for bacterial vaginosis at last visit.  Patient acquired antibiotic and completed the 7-day course.  Patient is currently without abnormal vaginal discharge.

## 2018-11-19 NOTE — Assessment & Plan Note (Signed)
Patient reports depressed mood over the past 2 years.  Symptoms started during pregnancy 2 years ago during which time patient had interpersonal conflict with friend.  Patient reports she has not been eating much as she does not have an appetite, she sleeps a lot, she has decreased energy and concentration, she often has thoughts that she would be better off dead but she has no active plan to hurt herself.  She normally would enjoy arts and crafts but has decreased interest in this activity.  Patient does endorse feeling warm often.  Denies palpitations, chest pain, shortness of breath.  Patient often feels very anxious as well.  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  We will see patient back in 3 months and evaluate progress, dosing * Patient is continuing meeting with [name] in behavioral health

## 2018-11-19 NOTE — Progress Notes (Signed)
   CC: Depression  HPI: Ms. Bonnie Cooper is a 23 year old female with past medical history significant for elevated blood pressure and depressive symptoms who presents with a chief complaint of depression.  Please see problem based charting for further discussion.  [name] is a 23 y.o.   Past Medical History:  Diagnosis Date  . Anxiety   . Chlamydia   . Depression    just recently, no meds. or tx  . Headache   . Sickle cell trait ([place])   . Tooth pain 09/25/2018   Review of Systems:   Review of Systems  Constitutional: Negative for chills and fever.  HENT: Negative for congestion.   Respiratory: Negative for cough and shortness of breath.   Cardiovascular: Negative for chest pain.  Gastrointestinal: Negative for abdominal pain, constipation, diarrhea, nausea and vomiting.  Genitourinary: Negative for dysuria, frequency and urgency.  All other systems reviewed and are negative. * Patient reports occasionally feeling warm  Physical Exam:  Vitals:   11/19/18 1319  BP: (!) 130/97  Pulse: 63  Temp: 98.4 F (36.9 C)  SpO2: 100%  Weight: 146 lb (66.2 kg)  Height: 5\' 3"  (1.6 m)   Physical Exam  Constitutional: She is well-developed, well-nourished, and in no distress.  HENT:  Head: Normocephalic and atraumatic.  Eyes: EOM are normal. Right eye exhibits no discharge. Left eye exhibits no discharge.  Neck: Normal range of motion. No tracheal deviation present.  Cardiovascular: Normal rate and regular rhythm. Exam reveals no gallop and no friction rub.  No murmur heard. Pulmonary/Chest: Effort normal and breath sounds normal. No respiratory distress. She has no wheezes. She has no rales.  Abdominal: Soft. She exhibits no distension. There is no abdominal tenderness. There is no rebound and no guarding.  Musculoskeletal: Normal range of motion.        General: No tenderness, deformity or edema.  Neurological: She is alert. Coordination normal.  Skin: Skin is warm and dry. No  rash noted. She is not diaphoretic. No erythema.  Psychiatric: Memory and judgment normal.     Assessment & Plan:   See Encounters Tab for problem based charting.  Patient discussed and seen with Dr. Angelia Mould

## 2018-11-19 NOTE — Patient Instructions (Addendum)
Today you were seen for depression. We are going to start you on a medication called prozac for your depression. You will take this medication once daily and this medication can overtime help improve your depression. It usually takes at least 4 weeks before it has an effect. We are also checking your thyroid function as issues with this can cause depression. We will call you with the results.

## 2018-11-20 LAB — T4, FREE: Free T4: 0.75 ng/dL — ABNORMAL LOW (ref 0.82–1.77)

## 2018-11-20 LAB — TSH: TSH: 4.88 u[IU]/mL — ABNORMAL HIGH (ref 0.450–4.500)

## 2018-11-22 ENCOUNTER — Other Ambulatory Visit: Payer: Self-pay | Admitting: Internal Medicine

## 2018-11-22 DIAGNOSIS — F321 Major depressive disorder, single episode, moderate: Secondary | ICD-10-CM

## 2018-11-22 MED ORDER — FLUOXETINE HCL 20 MG PO CAPS: 20.0000 mg | ORAL_CAPSULE | Freq: Every day | ORAL | 4 refills | Status: DC

## 2018-11-22 MED FILL — FLUoxetine HCL 20 MG CAPS: 20 | 30 days supply | Qty: 30 | Fill #0

## 2018-11-22 NOTE — Progress Notes (Signed)
Internal Medicine Clinic Attending  I saw and evaluated the patient.  I personally confirmed the key portions of the history and exam documented by Dr. MacLean and I reviewed pertinent patient test results.  The assessment, diagnosis, and plan were formulated together and I agree with the documentation in the resident's note.  

## 2018-12-17 ENCOUNTER — Other Ambulatory Visit: Payer: Self-pay

## 2018-12-17 ENCOUNTER — Ambulatory Visit (INDEPENDENT_AMBULATORY_CARE_PROVIDER_SITE_OTHER): Payer: Self-pay | Admitting: Licensed Clinical Social Worker

## 2018-12-17 DIAGNOSIS — F321 Major depressive disorder, single episode, moderate: Secondary | ICD-10-CM

## 2018-12-18 ENCOUNTER — Encounter: Payer: Self-pay | Admitting: Licensed Clinical Social Worker

## 2018-12-18 NOTE — BH Specialist Note (Signed)
Integrated Behavioral Health Follow Up Visit  MRN: [id number] Name: Bonnie Cooper  Number of [name] visits: 4/6 Session Start time: 10:35 Session End time: 11:00 Total time: 25 minutes  Type of Service: Integrated Behavioral Health- Individual Interpretor:No.   SUBJECTIVE: Bonnie Cooper is a 23 y.o. female accompanied by whom attended the session individually.  Patient was referred by Dr. Marva Panda for depression. Patient reports the following symptoms/concerns: sleeping too much, decreased appetite, lack of natural supports, and depression.  Duration of problem: two years, recent improvement; Severity of problem: mild  OBJECTIVE: Mood: Netural and Affect: Appropriate Risk of harm to self or others: No plan to harm self or others  LIFE CONTEXT: Family and Social: Patient is continuing to live with her ex-partner, but reported that she has no intentions of reconciling. Patient has increased her social interactions over the past several weeks. Patient continues to have challenges with friendships at times, and difficulties working through conflict. School/Work: Patient started college, and is continuing to work her job.  Self-Care: Needs improvement with sleeping too much as a coping skill.  Life Changes: Patient's depression levels have decreased since starting her medication last month.   GOALS ADDRESSED: Patient will: 1.  Reduce symptoms of: agitation, anxiety, depression, stress and negative communication skills.  2.  Increase knowledge and/or ability of: coping skills, healthy habits and stress reduction  3.  Demonstrate ability to: Increase healthy adjustment to current life circumstances and Increase adequate support systems for patient/family  INTERVENTIONS: Interventions utilized:  Motivational Interviewing, Behavioral Activation, Brief CBT, Supportive Counseling, Medication Monitoring and Sleep Hygiene Standardized Assessments completed: assessed for  SI, HI, and self-harm.  ASSESSMENT: Patient currently experiencing mild levels of depression and anxiety. Patient is reporting an improvement with her overall mood, and identified the number of depressed days have decreased. Patient is reporting medication compliance with Prozac. Patient continues to have days where she will sleep most of the day if she is feeling down. Pt was encouraged to implement other healthy strategies besides sleeping when feeling overwhelmed or depressed due to that only being a temporary solution. Patient continues to report issues with appetite, poor sleep hygiene, and irritability. Patient identified that she wants to continue to learn healthy ways to deal with her emotions and improve communication.   Patient may benefit from counseling.  PLAN: 1. Follow up with behavioral health clinician on : three weeks.  [name], [facility], [place]

## 2018-12-23 ENCOUNTER — Other Ambulatory Visit: Payer: Self-pay

## 2018-12-23 ENCOUNTER — Other Ambulatory Visit (INDEPENDENT_AMBULATORY_CARE_PROVIDER_SITE_OTHER): Payer: Self-pay

## 2018-12-23 DIAGNOSIS — F321 Major depressive disorder, single episode, moderate: Secondary | ICD-10-CM

## 2018-12-24 ENCOUNTER — Other Ambulatory Visit: Payer: Self-pay | Admitting: Internal Medicine

## 2018-12-24 LAB — T4, FREE: Free T4: 0.69 ng/dL — ABNORMAL LOW (ref 0.82–1.77)

## 2018-12-24 LAB — TSH: TSH: 7.74 u[IU]/mL — ABNORMAL HIGH (ref 0.450–4.500)

## 2018-12-24 LAB — T3, FREE: T3, Free: 3.2 pg/mL (ref 2.0–4.4)

## 2018-12-24 LAB — THYROID PEROXIDASE ANTIBODY: Thyroperoxidase Ab SerPl-aCnc: 48 IU/mL — ABNORMAL HIGH (ref 0–34)

## 2018-12-24 MED ORDER — LEVOTHYROXINE SODIUM 50 MCG PO TABS: 50.0000 ug | ORAL_TABLET | Freq: Every day | ORAL | 3 refills | Status: DC

## 2018-12-24 NOTE — Progress Notes (Signed)
Reviewed repeat thyroid studies from 10/12, consistent with hypothyroidism.  Called patient and informed of results.  Discussed benefits, risks, side effects of initiating levothyroxine therapy.  Patient is agreeable to initiation of therapy.  Will send prescription to [facility] outpatient pharmacy for levothyroxine 50 mcg daily.  Patient states that she has not yet picked up her Prozac prescription but will do so soon. Will schedule patient for followup in 6-8 weeks.

## 2018-12-31 ENCOUNTER — Ambulatory Visit: Payer: Medicaid Other | Admitting: Licensed Clinical Social Worker

## 2019-01-07 ENCOUNTER — Ambulatory Visit: Payer: Self-pay | Admitting: Internal Medicine

## 2019-01-07 ENCOUNTER — Other Ambulatory Visit: Payer: Self-pay

## 2019-01-07 VITALS — BP 99/66 | HR 100 | Temp 100.5°F | Ht 63.0 in | Wt 143.2 lb

## 2019-01-07 DIAGNOSIS — N939 Abnormal uterine and vaginal bleeding, unspecified: Secondary | ICD-10-CM | POA: Insufficient documentation

## 2019-01-07 DIAGNOSIS — R358 Other polyuria: Secondary | ICD-10-CM

## 2019-01-07 DIAGNOSIS — N12 Tubulo-interstitial nephritis, not specified as acute or chronic: Secondary | ICD-10-CM

## 2019-01-07 DIAGNOSIS — R319 Hematuria, unspecified: Secondary | ICD-10-CM

## 2019-01-07 LAB — POCT URINALYSIS DIPSTICK
Glucose, UA: NEGATIVE
Ketones, UA: NEGATIVE
Nitrite, UA: NEGATIVE
Protein, UA: POSITIVE — AB
Spec Grav, UA: 1.025 (ref 1.010–1.025)
Urobilinogen, UA: 0.2 E.U./dL
pH, UA: 6 (ref 5.0–8.0)

## 2019-01-07 MED ORDER — CEFTRIAXONE SODIUM 1 G IJ SOLR
1.0000 g | Freq: Once | INTRAMUSCULAR | Status: AC
  Administered 2019-01-07: 1 g via INTRAMUSCULAR

## 2019-01-07 MED ORDER — AMOXICILLIN-POT CLAVULANATE 875-125 MG PO TABS: 1.0000 | ORAL_TABLET | Freq: Two times a day (BID) | ORAL | 0 refills | Status: AC

## 2019-01-07 MED ORDER — SODIUM CHLORIDE 0.9 % IV BOLUS
1000.0000 mL | Freq: Once | INTRAVENOUS | Status: AC
  Administered 2019-01-07: 1000 mL via INTRAVENOUS

## 2019-01-07 MED FILL — FLUoxetine HCL 20 MG CAPS: 20 | 30 days supply | Qty: 30 | Fill #0

## 2019-01-07 MED FILL — AMOX-CLAV 875-125 MG TABLET: 875-125 | 10 days supply | Qty: 20 | Fill #0

## 2019-01-07 MED FILL — LEVOTHYROXINE 50 MCG TABLET: 50 | 30 days supply | Qty: 30 | Fill #0

## 2019-01-07 NOTE — Assessment & Plan Note (Signed)
Patient presents with 3 to 4 days of polyuria. She states that she has right flank pain associated with fatigue, nausea, fevers, and chills. She had a similar presentation back in 2015 and was told that she had a urinary tract infection. She has noticed dark-colored urine. She is tolerating PO intake and has been able to drink plenty of fluids but does states she has had a decreased appetite. On physical exam she is tachycardic and febrile but otherwise hemodynamically stable. Point-of-care dipstick with hematuria and polyuria.  A/P: - Concern for pyelonephritis given UA and systemic symptoms  - 1L bouls + Ceftriaxone now  - Start Augmentin 875-125 mg BID for 10 days - Return precautions given

## 2019-01-07 NOTE — Assessment & Plan Note (Signed)
Patient states that she started her period yesterday. She is typically very regular every 30 days. She is not sexually active with males. She is not having any pelvic pain. Never been told she has fibroids. Typically menstruations last 6 days.  A/P: - Continue to monitor. If bleeding persists will get transvaginal US and consider starting Megace.

## 2019-01-07 NOTE — Patient Instructions (Signed)
Thank you for allowing [place] to provide your care. I think you likely have pyelonephritis. Below is some information about symptoms to watch out for. If you get to the point where you are unable to eat or drink or hold down your medications please call the clinic immediately.  Pyelonephritis, Adult  Pyelonephritis is an infection that occurs in the kidney. The kidneys are organs that help clean the blood by moving waste out of the blood and into the pee (urine). This infection can happen quickly, or it can last for a long time. In most cases, it clears up with treatment and does not cause other problems. What are the causes? This condition may be caused by:  Germs (bacteria) going from the bladder up to the kidney. This may happen after having a bladder infection.  Germs going from the blood to the kidney. What increases the risk? This condition is more likely to develop in:  Pregnant women.  Older people.  People who have any of these conditions: ? Diabetes. ? Inflammation of the prostate gland (prostatitis), in males. ? Kidney stones or bladder stones. ? Other problems with the kidney or the parts of your body that carry pee from the kidneys to the bladder (ureters). ? Cancer.  People who have a small, thin tube (catheter) placed in the bladder.  People who are sexually active.  Women who use a medicine that kills sperm (spermicide) to prevent pregnancy.  People who have had a prior urinary tract infection (UTI). What are the signs or symptoms? Symptoms of this condition include:  Peeing often.  A strong urge to pee right away.  Burning or stinging when peeing.  Belly pain.  Back pain.  Pain in the side (flank area).  Fever or chills.  Blood in the pee, or dark pee.  Feeling sick to your stomach (nauseous) or throwing up (vomiting). How is this treated? This condition may be treated by:  Taking antibiotic medicines by mouth (orally).  Drinking enough fluids.  If the infection is bad, you may need to stay in the hospital. You may be given antibiotics and fluids that are put directly into a vein through an IV tube. In some cases, other treatments may be needed. Follow these instructions at home: Medicines  Take your antibiotic medicine as told by your doctor. Do not stop taking the antibiotic even if you start to feel better.  Take over-the-counter and prescription medicines only as told by your doctor. General instructions   Drink enough fluid to keep your pee pale yellow.  Avoid caffeine, tea, and carbonated drinks.  Pee (urinate) often. Avoid holding in pee for long periods of time.  Pee before and after sex.  After pooping (having a bowel movement), women should wipe from front to back. Use each tissue only once.  Keep all follow-up visits as told by your doctor. This is important. Contact a doctor if:  You do not feel better after 2 days.  Your symptoms get worse.  You have a fever. Get help right away if:  You cannot take your medicine or drink fluids as told.  You have chills and shaking.  You throw up.  You have very bad pain in your side or back.  You feel very weak or you pass out (faint). Summary  Pyelonephritis is an infection that occurs in the kidney.  In most cases, this infection clears up with treatment and does not cause other problems.  Take your antibiotic medicine as told by  your doctor. Do not stop taking the antibiotic even if you start to feel better.  Drink enough fluid to keep your pee pale yellow. This information is not intended to replace advice given to you by your health care provider. Make sure you discuss any questions you have with your health care provider. Document Released: 04/06/2004 Document Revised: 01/01/2018 Document Reviewed: 01/01/2018 Elsevier Patient Education  2020 [employer].

## 2019-01-07 NOTE — Progress Notes (Signed)
   CC: Dysuria and AUB  HPI:  Bonnie Cooper is a 23 y.o. female with PMHx listed below presenting for Dysuria and AUB. Please see the A&P for the status of the patient's chronic medical problems.  Past Medical History:  Diagnosis Date  . Anxiety   . Chlamydia   . Depression    just recently, no meds. or tx  . Headache   . Sickle cell trait ([place])   . Tooth pain 09/25/2018   Review of Systems:  Performed and all others negative.  Physical Exam: Vitals:   01/07/19 1015  BP: 99/66  Pulse: 100  Temp: (!) 100.5 F (38.1 C)  TempSrc: Oral  SpO2: 99%  Weight: 143 lb 3.2 oz (65 kg)  Height: 5\' 3"  (1.6 m)   General: Well nourished female in no acute distress Pulm: Good air movement with no wheezing or crackles  CV: Tachycardic but regular rhythm, no murmurs, no rubs  Abdomen: Active bowel sounds, soft, non-distended, no tenderness to palpation   Assessment & Plan:   See Encounters Tab for problem based charting.  Patient discussed with Dr. Lynnae January

## 2019-01-08 NOTE — Progress Notes (Signed)
Internal Medicine Clinic Attending  Case discussed with Dr. Helberg at the time of the visit.  We reviewed the resident's history and exam and pertinent patient test results.  I agree with the assessment, diagnosis, and plan of care documented in the resident's note.    

## 2019-01-09 ENCOUNTER — Telehealth: Payer: Self-pay | Admitting: *Deleted

## 2019-01-09 NOTE — Telephone Encounter (Signed)
Called patient to discuss her symptoms. She continue to have vaginal bleeding. Notes blood on the tampon when she removes it. She states that her urine is yellow. She does feel like the bleeding is improving. Her abdominal pain is improving and her appetite has returned. Discussed that if he bleeding recurs/gets worse to please seek medical care immediately. We are awaiting her final sensitivities for her urine culture but clinically it sounds like she is improving.   Thanks, Ina Homes, MD

## 2019-01-09 NOTE — Telephone Encounter (Signed)
Patient called in stating she is having frank blood with urination x 2 days; does not think this is menses. Also, c/o abd pain, and right flank pain, rating both 6.5/10 at present. States 2 days ago both were 10/10. Denies fever, chills, nausea. States she is taking her antibiotic. Please advise. Hubbard Hartshorn, BSN, RN-BC

## 2019-01-11 ENCOUNTER — Telehealth: Payer: Self-pay | Admitting: Internal Medicine

## 2019-01-11 LAB — URINE CULTURE

## 2019-01-11 NOTE — Telephone Encounter (Signed)
Called patient to discuss culture results. No answer. HIPAA compliant voicemail left. Her cultures grew E. Coli sensitive to Augmentin.   Ina Homes, MD

## 2019-02-18 ENCOUNTER — Ambulatory Visit (INDEPENDENT_AMBULATORY_CARE_PROVIDER_SITE_OTHER): Payer: Self-pay | Admitting: Internal Medicine

## 2019-02-18 ENCOUNTER — Other Ambulatory Visit: Payer: Self-pay

## 2019-02-18 ENCOUNTER — Encounter: Payer: Self-pay | Admitting: Internal Medicine

## 2019-02-18 DIAGNOSIS — E038 Other specified hypothyroidism: Secondary | ICD-10-CM | POA: Insufficient documentation

## 2019-02-18 DIAGNOSIS — E063 Autoimmune thyroiditis: Secondary | ICD-10-CM

## 2019-02-18 DIAGNOSIS — Z79890 Hormone replacement therapy (postmenopausal): Secondary | ICD-10-CM

## 2019-02-18 DIAGNOSIS — E039 Hypothyroidism, unspecified: Secondary | ICD-10-CM | POA: Insufficient documentation

## 2019-02-18 DIAGNOSIS — O99280 Endocrine, nutritional and metabolic diseases complicating pregnancy, unspecified trimester: Secondary | ICD-10-CM | POA: Insufficient documentation

## 2019-02-18 NOTE — Assessment & Plan Note (Signed)
Patient presents for continued evaluation and management of her hypothyroidism. On review of records indicates that she was previously diagnosed in October with an elevated TSH and positive anti-TPO antibodies consistent with Hashimoto's thyroiditis. Since that time she has been taking levothyroxine 50 mcg per day. She states that she takes this in the morning with breakfast. She does feel that her depression has improved. She denies other symptoms consistent with hypo or hypothyroidism.  A/P: - Recheck TSH today. Will adjust levothyroxine accordingly.  - Discussed how to appropriately take levothyroxine on an empty stomach with water and weigh at least 30 minutes before consumption of other fluids or food. - She will follow-up in three months for repeat check.

## 2019-02-18 NOTE — Patient Instructions (Signed)
Thank you for allowing [place] to provide your care. Today we are gonna check your thyroid hormone levels. I will call you if we need to change anything.  For your levothyroxine make sure that you're taking this in the morning with a glass of water on an empty stomach. Please wait at least 30 minutes before you consume any other food or beverage products.  Please follow-up with Dr. Aundra Dubin in three months or sooner if any issues arise.

## 2019-02-18 NOTE — Progress Notes (Signed)
   CC: Hypothyroidims  HPI:  Bonnie Cooper is a 23 y.o. female with PMHx listed below presenting for hypothyroidism. Please see the A&P for the status of the patient's chronic medical problems.  Past Medical History:  Diagnosis Date  . Anxiety   . Chlamydia   . Depression    just recently, no meds. or tx  . Headache   . Sickle cell trait ([place])   . Tooth pain 09/25/2018   Review of Systems:  Performed and all others negative.  Physical Exam: Vitals:   02/18/19 1412  BP: 123/78  Pulse: 64  Temp: 98.6 F (37 C)  TempSrc: Oral  SpO2: 100%  Weight: 151 lb 1.6 oz (68.5 kg)  Height: 5\' 3"  (1.6 m)   General: Well nourished female in no acute distress Pulm: Good air movement with no wheezing or crackles  CV: RRR, no murmurs, no rubs   Assessment & Plan:   See Encounters Tab for problem based charting.  Patient discussed with Dr. Dareen Piano

## 2019-02-19 LAB — TSH: TSH: 4.21 u[IU]/mL (ref 0.450–4.500)

## 2019-02-19 NOTE — Progress Notes (Signed)
Internal Medicine Clinic Attending  Case discussed with Dr. Helberg at the time of the visit.  We reviewed the resident's history and exam and pertinent patient test results.  I agree with the assessment, diagnosis, and plan of care documented in the resident's note.    

## 2019-03-03 ENCOUNTER — Other Ambulatory Visit: Payer: Self-pay

## 2019-03-03 ENCOUNTER — Emergency Department (HOSPITAL_COMMUNITY)
Admission: EM | Admit: 2019-03-03 | Discharge: 2019-03-03 | Disposition: A | Discharge status: Home / Self Care | Payer: Medicaid Other | Attending: Emergency Medicine | Admitting: Emergency Medicine

## 2019-03-03 ENCOUNTER — Encounter (HOSPITAL_COMMUNITY): Payer: Self-pay | Admitting: Emergency Medicine

## 2019-03-03 DIAGNOSIS — R519 Headache, unspecified: Secondary | ICD-10-CM | POA: Insufficient documentation

## 2019-03-03 DIAGNOSIS — R110 Nausea: Secondary | ICD-10-CM | POA: Insufficient documentation

## 2019-03-03 DIAGNOSIS — Z3202 Encounter for pregnancy test, result negative: Secondary | ICD-10-CM | POA: Insufficient documentation

## 2019-03-03 DIAGNOSIS — M545 Low back pain: Secondary | ICD-10-CM | POA: Insufficient documentation

## 2019-03-03 DIAGNOSIS — E039 Hypothyroidism, unspecified: Secondary | ICD-10-CM | POA: Insufficient documentation

## 2019-03-03 DIAGNOSIS — R7989 Other specified abnormal findings of blood chemistry: Secondary | ICD-10-CM | POA: Insufficient documentation

## 2019-03-03 DIAGNOSIS — R8290 Unspecified abnormal findings in urine: Secondary | ICD-10-CM | POA: Insufficient documentation

## 2019-03-03 LAB — CBC WITH DIFFERENTIAL/PLATELET
Abs Immature Granulocytes: 0.02 10*3/uL (ref 0.00–0.07)
Basophils Absolute: 0 10*3/uL (ref 0.0–0.1)
Basophils Relative: 0 %
Eosinophils Absolute: 0 10*3/uL (ref 0.0–0.5)
Eosinophils Relative: 1 %
HCT: 33.2 % — ABNORMAL LOW (ref 36.0–46.0)
Hemoglobin: 11.4 g/dL — ABNORMAL LOW (ref 12.0–15.0)
Immature Granulocytes: 0 %
Lymphocytes Relative: 22 %
Lymphs Abs: 1.4 10*3/uL (ref 0.7–4.0)
MCH: 30.2 pg (ref 26.0–34.0)
MCHC: 34.3 g/dL (ref 30.0–36.0)
MCV: 87.8 fL (ref 80.0–100.0)
Monocytes Absolute: 0.5 10*3/uL (ref 0.1–1.0)
Monocytes Relative: 7 %
Neutro Abs: 4.3 10*3/uL (ref 1.7–7.7)
Neutrophils Relative %: 70 %
Platelets: 190 10*3/uL (ref 150–400)
RBC: 3.78 MIL/uL — ABNORMAL LOW (ref 3.87–5.11)
RDW: 14.4 % (ref 11.5–15.5)
WBC: 6.2 10*3/uL (ref 4.0–10.5)
nRBC: 0 % (ref 0.0–0.2)

## 2019-03-03 LAB — BASIC METABOLIC PANEL
Anion gap: 9 (ref 5–15)
BUN: 7 mg/dL (ref 6–20)
CO2: 23 mmol/L (ref 22–32)
Calcium: 9.1 mg/dL (ref 8.9–10.3)
Chloride: 105 mmol/L (ref 98–111)
Creatinine, Ser: 1.14 mg/dL — ABNORMAL HIGH (ref 0.44–1.00)
GFR calc Af Amer: >60 mL/min (ref >60)
GFR calc non Af Amer: >60 mL/min (ref >60)
Glucose, Bld: 84 mg/dL (ref 70–99)
Potassium: 3.4 mmol/L — ABNORMAL LOW (ref 3.5–5.1)
Sodium: 137 mmol/L (ref 135–145)

## 2019-03-03 LAB — URINALYSIS, ROUTINE W REFLEX MICROSCOPIC
Bacteria, UA: NONE SEEN
Bilirubin Urine: NEGATIVE
Glucose, UA: NEGATIVE mg/dL
Hgb urine dipstick: NEGATIVE
Ketones, ur: NEGATIVE mg/dL
Nitrite: NEGATIVE
Protein, ur: NEGATIVE mg/dL
Specific Gravity, Urine: 1.013 (ref 1.005–1.030)
pH: 6 (ref 5.0–8.0)

## 2019-03-03 LAB — PREGNANCY, URINE: Preg Test, Ur: NEGATIVE

## 2019-03-03 MED ORDER — IBUPROFEN 800 MG PO TABS: 800.0000 mg | ORAL_TABLET | Freq: Three times a day (TID) | ORAL | 0 refills | Status: DC | PRN

## 2019-03-03 MED ORDER — SODIUM CHLORIDE 0.9 % IV BOLUS
1000.0000 mL | Freq: Once | INTRAVENOUS | Status: AC
  Administered 2019-03-03: 1000 mL via INTRAVENOUS

## 2019-03-03 MED ORDER — KETOROLAC TROMETHAMINE 30 MG/ML IJ SOLN
30.0000 mg | Freq: Once | INTRAMUSCULAR | Status: AC
  Administered 2019-03-03: 30 mg via INTRAVENOUS
  Filled 2019-03-03: qty 1

## 2019-03-03 MED FILL — IBUPROFEN 800 MG TABS: 800 | 7 days supply | Qty: 21 | Fill #0

## 2019-03-03 NOTE — ED Triage Notes (Signed)
Pt presents with persistent headache since Saturday after having hookah. The next day she experienced nausea without vomiting, and lower back pain without injury. A/O x4 NAD.

## 2019-03-03 NOTE — ED Provider Notes (Addendum)
[facility] EMERGENCY DEPARTMENT Provider Note   CSN: [id number] Arrival date & time: 03/03/19  1036     History Chief Complaint  Patient presents with  . Migraine  . Back Pain    Bonnie Cooper is a 23 y.o. female.  HPI Patient presents to the emergency department with headache she states that started after smoking at a hookah lounge.  She states that the next day she experienced nausea with no vomiting and she is had lower back pain from that timeframe as well.  Patient states the headache is the main issue in the back pain seems to be coming and going.  The patient states that nothing seems to make the condition better or worse.  The patient states that she did not take any medications prior to arrival for her symptoms.  The patient denies chest pain, shortness of breath,blurred vision, neck pain, fever, cough, weakness, numbness, dizziness, anorexia, edema, abdominal pain, nausea, vomiting, diarrhea, rash, back pain, dysuria, hematemesis, bloody stool, near syncope, or syncope.    Past Medical History:  Diagnosis Date  . Anxiety   . Chlamydia   . Depression    just recently, no meds. or tx  . Headache   . Sickle cell trait ([place])   . Tooth pain 09/25/2018    Patient Active Problem List   Diagnosis Date Noted  . Hypothyroidism 02/18/2019  . Pyelonephritis 01/07/2019  . Abnormal uterine bleeding 01/07/2019  . Major depressive disorder 11/19/2018  . Vaginal discharge 10/29/2018  . Elevated BP without diagnosis of hypertension 08/06/2018  . Nonintractable headache 08/06/2018    Past Surgical History:  Procedure Laterality Date  . HERNIA REPAIR    . navel  2001     OB History    Gravida  1   Para  1   Term  1   Preterm      AB      Living  1     SAB      TAB      Ectopic      Multiple  0   Live Births  1           History reviewed. No pertinent family history.  Social History   Tobacco Use  . Smoking status: Never Smoker    . Smokeless tobacco: Never Used  Substance Use Topics  . Alcohol use: Yes    Comment: SOCIALLY   . Drug use: Not Currently    Types: Marijuana    Home Medications Prior to Admission medications   Medication Sig Start Date End Date Taking? Authorizing Provider  aspirin-acetaminophen-caffeine (EXCEDRIN MIGRAINE) [phone] MG tablet Take 1 tablet by mouth daily as needed for headache.   Yes [provider]  dibucaine (NUPERCAINAL) 1 % OINT Place 1 application rectally as needed for hemorrhoids. Patient not taking: Reported on 03/03/2019 02/11/16   Cresenzo-Dishmon, Joaquim Lai, CNM  FLUoxetine (PROZAC) 20 MG capsule Take 1 capsule (20 mg total) by mouth daily. Patient not taking: Reported on 03/03/2019 11/22/18   Jeanmarie Hubert, MD  levothyroxine (SYNTHROID) 50 MCG tablet Take 1 tablet (50 mcg total) by mouth daily before breakfast. Patient not taking: Reported on 03/03/2019 12/24/18   Jeanmarie Hubert, MD    Allergies    Patient has no known allergies.  Review of Systems   Review of Systems All other systems negative except as documented in the HPI. All pertinent positives and negatives as reviewed in the HPI. Physical Exam Updated Vital Signs BP Marland Kitchen)  128/96   Pulse 88   Temp 99.1 F (37.3 C) (Oral)   Resp 20   LMP 02/28/2019 (Exact Date)   SpO2 100%   Breastfeeding No   Physical Exam Vitals and nursing note reviewed.  Constitutional:      General: She is not in acute distress.    Appearance: She is well-developed.  HENT:     Head: Normocephalic and atraumatic.  Eyes:     Pupils: Pupils are equal, round, and reactive to light.  Cardiovascular:     Rate and Rhythm: Normal rate and regular rhythm.     Heart sounds: Normal heart sounds. No murmur. No friction rub. No gallop.   Pulmonary:     Effort: Pulmonary effort is normal. No respiratory distress.     Breath sounds: Normal breath sounds. No wheezing.  Abdominal:     General: Bowel sounds are normal. There is  no distension.     Palpations: Abdomen is soft.     Tenderness: There is no abdominal tenderness.  Musculoskeletal:     Cervical back: Normal range of motion and neck supple.  Skin:    General: Skin is warm and dry.     Capillary Refill: Capillary refill takes less than 2 seconds.     Findings: No erythema or rash.  Neurological:     Mental Status: She is alert and oriented to person, place, and time.     Motor: No abnormal muscle tone.     Coordination: Coordination normal.  Psychiatric:        Behavior: Behavior normal.     ED Results / Procedures / Treatments   Labs (all labs ordered are listed, but only abnormal results are displayed) Labs Reviewed  URINALYSIS, ROUTINE W REFLEX MICROSCOPIC - Abnormal; Notable for the following components:      Result Value   APPearance HAZY (*)    Leukocytes,Ua SMALL (*)    All other components within normal limits  CBC WITH DIFFERENTIAL/PLATELET - Abnormal; Notable for the following components:   RBC 3.78 (*)    Hemoglobin 11.4 (*)    HCT 33.2 (*)    All other components within normal limits  BASIC METABOLIC PANEL - Abnormal; Notable for the following components:   Potassium 3.4 (*)    Creatinine, Ser 1.14 (*)    All other components within normal limits  URINE CULTURE  PREGNANCY, URINE    EKG None  Radiology No results found.  Procedures Procedures (including critical care time)  Medications Ordered in ED Medications  sodium chloride 0.9 % bolus 1,000 mL (1,000 mLs Intravenous New Bag/Given 03/03/19 1239)  ketorolac (TORADOL) 30 MG/ML injection 30 mg (30 mg Intravenous Given 03/03/19 1239)    ED Course  I have reviewed the triage vital signs and the nursing notes.  Pertinent labs & imaging results that were available during my care of the patient were reviewed by me and considered in my medical decision making (see chart for details).    MDM Rules/Calculators/A&P                     Patient does not have any signs  or symptoms of any acute illness at this time.  Patient's vital signs have remained stable.  She does not have any abnormalities in her oximetry blood pressure or temperature.  I have advised patient to return here as needed.  Patient agrees the plan and all questions were answered.  Patient was treated with IV fluids and  Toradol. Final Clinical Impression(s) / ED Diagnoses Final diagnoses:  None    Rx / DC Orders ED Discharge Orders    None       Charlestine NightLawyer, Gardiner [place], PA-C 03/03/19 [address]1605    Laquenta Whitsell, Pepperdine Universityhristopher, PA-C 03/03/19 1606    Mancel BaleWentz, Elliott, MD 03/03/19 1711

## 2019-03-03 NOTE — ED Notes (Signed)
Discharge instructions and prescription discussed with Pt. Pt verbalized understanding. Pt stable and ambulatory.    

## 2019-03-03 NOTE — Discharge Instructions (Addendum)
Your testing here today did not show any major abnormalities.  Increase your fluid intake and return here as needed.  Follow-up with your primary doctor.

## 2019-03-04 LAB — URINE CULTURE: Culture: NO GROWTH

## 2019-03-07 ENCOUNTER — Encounter (HOSPITAL_COMMUNITY): Payer: Self-pay | Admitting: Pharmacy Technician

## 2019-03-07 ENCOUNTER — Emergency Department (HOSPITAL_COMMUNITY)
Admission: EM | Admit: 2019-03-07 | Discharge: 2019-03-07 | Disposition: A | Discharge status: Home / Self Care | Payer: Medicaid Other | Attending: Emergency Medicine | Admitting: Emergency Medicine

## 2019-03-07 ENCOUNTER — Other Ambulatory Visit: Payer: Self-pay

## 2019-03-07 DIAGNOSIS — S0083XA Contusion of other part of head, initial encounter: Secondary | ICD-10-CM

## 2019-03-07 DIAGNOSIS — Y9389 Activity, other specified: Secondary | ICD-10-CM | POA: Insufficient documentation

## 2019-03-07 DIAGNOSIS — Y998 Other external cause status: Secondary | ICD-10-CM | POA: Insufficient documentation

## 2019-03-07 DIAGNOSIS — E039 Hypothyroidism, unspecified: Secondary | ICD-10-CM | POA: Insufficient documentation

## 2019-03-07 DIAGNOSIS — Y92410 Unspecified street and highway as the place of occurrence of the external cause: Secondary | ICD-10-CM | POA: Insufficient documentation
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
## 2019-03-07 NOTE — ED Notes (Signed)
MD notified of BP
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
## 2019-03-07 NOTE — Discharge Instructions (Signed)
Use ice ibuprofen and Tylenol as needed for pain.  For new concerns see a clinician.  Soft foods as tolerated.

## 2019-03-07 NOTE — ED Notes (Signed)
ED Provider at bedside. 

## 2019-04-02 ENCOUNTER — Ambulatory Visit: Payer: Medicaid Other

## 2019-04-02 ENCOUNTER — Other Ambulatory Visit: Payer: Self-pay

## 2019-04-24 ENCOUNTER — Other Ambulatory Visit: Payer: Self-pay

## 2019-04-24 ENCOUNTER — Encounter: Payer: Self-pay | Admitting: Internal Medicine

## 2019-04-24 ENCOUNTER — Ambulatory Visit (INDEPENDENT_AMBULATORY_CARE_PROVIDER_SITE_OTHER): Payer: Self-pay | Admitting: Internal Medicine

## 2019-04-24 VITALS — BP 123/78 | HR 74 | Temp 98.2°F | Ht 63.0 in | Wt 156.3 lb

## 2019-04-24 DIAGNOSIS — N390 Urinary tract infection, site not specified: Secondary | ICD-10-CM

## 2019-04-24 DIAGNOSIS — E063 Autoimmune thyroiditis: Secondary | ICD-10-CM

## 2019-04-24 DIAGNOSIS — R8290 Unspecified abnormal findings in urine: Secondary | ICD-10-CM

## 2019-04-24 DIAGNOSIS — Z79890 Hormone replacement therapy (postmenopausal): Secondary | ICD-10-CM

## 2019-04-24 DIAGNOSIS — E038 Other specified hypothyroidism: Secondary | ICD-10-CM

## 2019-04-24 DIAGNOSIS — Z87448 Personal history of other diseases of urinary system: Secondary | ICD-10-CM

## 2019-04-24 LAB — POCT URINALYSIS DIPSTICK (MANUAL)
Nitrite, UA: POSITIVE — AB
Poct Bilirubin: NEGATIVE
Poct Blood: NEGATIVE
Poct Glucose: NORMAL mg/dL
Poct Ketones: NEGATIVE
Poct Protein: 30 mg/dL — AB
Poct Urobilinogen: NORMAL mg/dL
Spec Grav, UA: 1.025 (ref 1.010–1.025)
pH, UA: 7 (ref 5.0–8.0)

## 2019-04-24 LAB — POCT URINE PREGNANCY: Preg Test, Ur: NEGATIVE

## 2019-04-24 MED ORDER — AMOXICILLIN-POT CLAVULANATE 875-125 MG PO TABS: 1.0000 | ORAL_TABLET | Freq: Two times a day (BID) | ORAL | 0 refills | Status: AC

## 2019-04-24 MED FILL — AMOX-CLAV 875-125 MG TABLET: 875-125 | 5 days supply | Qty: 10 | Fill #0

## 2019-04-24 NOTE — Progress Notes (Signed)
   CC: Cloudy urine  HPI:  Bonnie Cooper is a 24 y.o. year-old female with PMH listed below who presents to clinic for cloudy urine. Please see problem based assessment and plan for further details.   Past Medical History:  Diagnosis Date  . Anxiety   . Chlamydia   . Depression    just recently, no meds. or tx  . Headache   . Sickle cell trait ([facility])   . Tooth pain 09/25/2018   Review of Systems:   Review of Systems  Constitutional: Negative for malaise/fatigue and weight loss.       Weight gain  Eyes: Negative for double vision and photophobia.  Gastrointestinal: Negative for abdominal pain, nausea and vomiting.  Genitourinary: Negative for dysuria, flank pain, frequency and urgency.       No menstrual irregularities   Neurological: Negative for dizziness and headaches.  Psychiatric/Behavioral: Negative for depression.    Physical Exam:  Vitals:   04/24/19 0916  BP: 123/78  Pulse: 74  Temp: 98.2 F (36.8 C)  TempSrc: Oral  SpO2: 100%  Weight: 156 lb 4.8 oz (70.9 kg)  Height: 5\' 3"  (1.6 m)    General: Well-appearing female in no acute distress Neck:  No goiter or thyroid nodules, no bruits CV: RRR, no mrg  Abd: soft, NTND, bowel sounds present, no suprapubic tenderness    Assessment & Plan:   See Encounters Tab for problem based charting.  Patient discussed with Dr. 

## 2019-04-24 NOTE — Assessment & Plan Note (Signed)
Patient presents with a 2-day history of cloudy urine that is not associated with any other symptoms.  She has a history of pyelonephritis 2 months ago that started with similar symptoms and she is concerned that she has a urinary tract infection again.  She denies flank pain, fevers, and chills.  Urine dipstick was positive for leukocytes and nitrites suggestive of UTI.  Management as below.  She is also concerned she may be pregnant due to sensation of tingling in her nipples.  She is sexually active.  LMP on 1/14.  Reports having sexual intercourse during ovulation.  - Augmentin 1 tablet twice daily x5 days - UA and urine culture ordered - POC UPT

## 2019-04-24 NOTE — Patient Instructions (Addendum)
Ms. Greenfield,   You have a urinary tract infection. I sent a prescription for Augmentin to the [facility]. Please take 1 tablet twice a day for 5 days. Call us if your symptoms worsen.   Your pregnancy test was negative.   Please be vigilant of symptoms of hypothyroidism as you are not taking medications at this time. These include but are not limited to depression, cold and exercise intolerance, menstrual irregularities, hair loss, skin changes, itching, loss of hair, etc. In the long term, this can lead to problems with your eyes and your heart. We will be more than happy to repeat blood work to check your thyroid and restart your thyroid medicine.   Call us if you have any questions or concerns.   - Dr. Evelene Croon

## 2019-04-24 NOTE — Assessment & Plan Note (Signed)
Patient has a history of hypothyroidism secondary to Hashimoto's that was diagnosed after she presented with an episode of depression.  She was started on medication with improvement in symptoms as well as thyroid function tests.  However she stopped taking the medication 2 months ago after her symptoms improved.  She reports some weight gain, but denies cold/exercise intolerance, fatigue, menstrual irregularities, skin changes, hair loss, and changes in vision.  We discussed the benefits of levothyroxine as well as a long-term effects of untreated/uncontrolled hypothyroidism including but not limited to eye disease, cardiomyopathy, congenital disease of the fetus, etc.  However patient stated she is feeling well now and that she does not want to repeat labs or restart the medication. I explained to her that her recent weight gain is likely a symptom of hypothyroidism, but she once again declined and stated she will contact us in the future if she experiences any symptoms. I gave a her a list of symptoms that she should be watching for as well as information on Hashimoto's disease.

## 2019-04-25 LAB — URINALYSIS, ROUTINE W REFLEX MICROSCOPIC
Bilirubin, UA: NEGATIVE
Glucose, UA: NEGATIVE
Ketones, UA: NEGATIVE
Nitrite, UA: NEGATIVE
RBC, UA: NEGATIVE
Specific Gravity, UA: 1.021 (ref 1.005–1.030)
Urobilinogen, Ur: 0.2 mg/dL (ref 0.2–1.0)
pH, UA: 6.5 (ref 5.0–7.5)

## 2019-04-25 LAB — MICROSCOPIC EXAMINATION
Casts: NONE SEEN /lpf
Epithelial Cells (non renal): >10 /hpf — AB (ref 0–10)

## 2019-04-26 LAB — URINE CULTURE

## 2019-04-30 NOTE — Progress Notes (Signed)
Internal Medicine Clinic Attending  Case discussed with Dr. Santos-Sanchez at the time of the visit.  We reviewed the resident's history and exam and pertinent patient test results.  I agree with the assessment, diagnosis, and plan of care documented in the resident's note.    

## 2019-07-01 ENCOUNTER — Encounter: Payer: Self-pay | Admitting: *Deleted

## 2019-07-24 ENCOUNTER — Other Ambulatory Visit (HOSPITAL_COMMUNITY)
Admission: RE | Admit: 2019-07-24 | Discharge: 2019-07-24 | Disposition: A | Discharge status: Home / Self Care | Payer: Medicaid Other | Source: Ambulatory Visit | Attending: Internal Medicine | Admitting: Internal Medicine

## 2019-07-24 ENCOUNTER — Ambulatory Visit: Payer: Self-pay | Admitting: Internal Medicine

## 2019-07-24 ENCOUNTER — Encounter: Payer: Self-pay | Admitting: Internal Medicine

## 2019-07-24 ENCOUNTER — Other Ambulatory Visit: Payer: Self-pay

## 2019-07-24 VITALS — BP 136/85 | HR 77 | Temp 98.5°F | Ht 63.0 in | Wt 154.9 lb

## 2019-07-24 DIAGNOSIS — N898 Other specified noninflammatory disorders of vagina: Secondary | ICD-10-CM

## 2019-07-24 LAB — POCT URINALYSIS DIPSTICK
Bilirubin, UA: NEGATIVE
Blood, UA: NEGATIVE
Glucose, UA: NEGATIVE
Ketones, UA: NEGATIVE
Nitrite, UA: NEGATIVE
Protein, UA: NEGATIVE
Spec Grav, UA: 1.025 (ref 1.010–1.025)
Urobilinogen, UA: 0.2 E.U./dL
pH, UA: 6.5 (ref 5.0–8.0)

## 2019-07-24 LAB — POCT URINE PREGNANCY: Preg Test, Ur: NEGATIVE

## 2019-07-24 NOTE — Progress Notes (Signed)
Internal Medicine Clinic Attending  Case discussed with Dr. Delma Officer at the time of the visit.  We reviewed the resident's history and exam and pertinent patient test results.  I agree with the assessment, diagnosis, and plan of care documented in the resident's note.  Will follow up on test results. At next visit, will want to ensure we address contraception.  Jessy Oto, M.D., Ph.D.

## 2019-07-24 NOTE — Patient Instructions (Signed)
It was a pleasure to see you today Ms. Bonnie Cooper. Please make the following changes:  During this visit you were tested for STDs, pregnancy.  I will call you with the results.  Please make sure to use protection during every sexual encounter.  If you have any questions or concerns, please call our clinic at [phone] between 9am-5pm and after hours call [phone] and ask for the internal medicine resident on call. If you feel you are having a medical emergency please call 911.   Thank you, we look forward to help you remain healthy!  Lars Mage, MD Internal Medicine PGY3  To schedule an appointment for a COVID vaccine or be added to the vaccine wait list: Go to [email or web address]   OR Go to [email or web address]                  OR Call [phone]                                     OR Call [phone] and select Option 2    Vaginitis  Vaginitis is irritation and swelling (inflammation) of the vagina. It happens when normal bacteria and yeast in the vagina grow too much. There are many types of this condition. Treatment will depend on the type you have. Follow these instructions at home: Lifestyle  Keep your vagina area clean and dry. ? Avoid using soap. ? Rinse the area with water.  Do not do the following until your doctor says it is okay: ? Wash and clean out the vagina (douche). ? Use tampons. ? Have sex.  Wipe from front to back after going to the bathroom.  Let air reach your vagina. ? Wear cotton underwear. ? Do not wear:  Underwear while you sleep.  Tight pants.  Thong underwear.  Underwear or nylons without a cotton panel. ? Take off any wet clothing, such as bathing suits, as soon as possible.  Use gentle, non-scented products. Do not use things that can irritate the vagina, such as fabric softeners. Avoid the following products if they are scented: ? Feminine sprays. ? Detergents. ? Tampons. ? Feminine hygiene products. ? Soaps  or bubble baths.  Practice safe sex and use condoms. General instructions  Take over-the-counter and prescription medicines only as told by your doctor.  If you were prescribed an antibiotic medicine, take or use it as told by your doctor. Do not stop taking or using the antibiotic even if you start to feel better.  Keep all follow-up visits as told by your doctor. This is important. Contact a doctor if:  You have pain in your belly.  You have a fever.  Your symptoms last for more than 2-3 days. Get help right away if:  You have a fever and your symptoms get worse all of a sudden. Summary  Vaginitis is irritation and swelling of the vagina. It can happen when the normal bacteria and yeast in the vagina grow too much. There are many types.  Treatment will depend on the type you have.  Do not douche, use tampons , or have sex until your health care provider approves. When you can return to sex, practice safe sex and use condoms. This information is not intended to replace advice given to you by your health care provider. Make sure you discuss any questions you have with your health care provider. Document Revised: 02/09/2017 Document Reviewed: 03/21/2016 Elsevier  Patient Education  [employer].   Pregnancy and Sexually Transmitted Infections An STI (sexually transmitted infection) is a disease or infection that may be passed (transmitted) from person to person, usually during sexual activity. This may happen by way of saliva, semen, blood, vaginal mucus, or urine. An STI can be caused by bacteria, viruses, or parasites. During pregnancy, STIs can be dangerous for you and your unborn baby. It is important to take steps to reduce your chances of getting an STI. Also, you need to be seen by your health care provider right away if you think you may have an STI, or if you think you may have been exposed to an STI. Diagnosis and treatment will depend on the type of STI. If you are  already pregnant, you will be screened for HIV (human immunodeficiency virus) early in your pregnancy. If you are at high risk for HIV, this test may be repeated during your third trimester of pregnancy. What are some common STIs? There are different types of STIs. Some STIs that cause problems in pregnancy include:  Gonorrhea.  Chlamydia.  Syphilis.  HIV and AIDS (acquired immunodeficiency syndrome).  Genital herpes.  Hepatitis.  Genital warts.  Human papillomavirus (HPV).  Trichomoniasis. STIs that do not affect the baby include:  Chancroid.  Pubic lice. What are the possible effects of STIs during pregnancy? STIs can cause:  Stillbirth.  Miscarriage.  Premature labor.  Premature rupture of the membranes.  Serious birth defects or deformities.  Infection of the amniotic sac.  Infections that occur after birth (postpartum) in you and the baby.  Slowed growth of the baby before birth.  Illnesses in newborns. What are common symptoms of STIs? Different STIs have different symptoms. Some women may not have any symptoms. If symptoms are present, they may include:  Painful or bloody urination.  Pain in the pelvis, abdomen, vagina, anus, throat, or eyes.  A skin rash, itching, or irritation.  Growths, ulcerations, blisters, or sores in the genital and anal areas.  Fever.  Abnormal vaginal discharge, with or without bad odor.  Pain or bleeding during sexual intercourse.  Yellowing of the skin and the white parts of the eyes (jaundice).  Swollen glands in the groin area. Even if symptoms are not present, an STI can still be passed to another person during sexual contact. How are STIs diagnosed? Your health care provider can use tests to determine if you have an STI. These may include blood tests, urine tests, and tests performed during a pelvic exam. You should be screened for STIs, including gonorrhea and chlamydia, if:  You are sexually active and are  younger than age 1.  You are age 30 or older and your health care provider tells you that you are at risk for these types of infections.  Your sexual activity has changed since the last time you were screened, and you are at an increased risk for chlamydia or gonorrhea. Ask your health care provider if you are at risk. How can I reduce my risk of getting an STI? Take these actions to reduce your risk of getting an STI:  Do not have any oral, vaginal, or anal sex. This is known as practicing abstinence.  If you have sex, use a latex condom or a female condom consistently and correctly during sexual intercourse.  Use dental dams and water-soluble lubricants during sexual activity. Do not use petroleum jelly or oils.  Avoid having multiple sexual partners.  Do not have sex with  someone who has other sexual partners.  Do not have sex with anyone you do not know or who is at high risk for an STI.  Avoid risky sex acts that can break the skin.  Do not have sex if you have open sores on your mouth or skin.  Avoid engaging in oral and anal sex acts.  Get the hepatitis vaccine. It is safe for pregnant women.  What should I do if I think I have an STI?  See your health care provider.  Tell your sexual partner or partners. They should be tested and treated for any STIs.  Do not have sex until your health care provider says it is okay. Get help right away if: Contact your health care provider right away if:  You have any symptoms of an STI.  You think that you have, or your sexual partner has, an STI even if there are no symptoms.  You think that you may have been exposed to an STI. This information is not intended to replace advice given to you by your health care provider. Make sure you discuss any questions you have with your health care provider. Document Revised: 06/21/2018 Document Reviewed: 10/04/2015 Elsevier Patient Education  2020 [employer].

## 2019-07-24 NOTE — Assessment & Plan Note (Signed)
Patient states that she has been having itching in her vaginal area for the past 1 week. She states that she notices a thick white colored discharge. She has not taken any recent antibiotics. She is currently sexual active with 1 partner and inconsistently uses protection. Last sexual encounter was last night and last menstrual period was 07/05/19.  She states that she uses [place] soap in the vaginal area.  Assessment and plan  On speculum examination of the patient she had white-colored discharge in the vaginal and cervical area that is suggestive of possible Gardnerella vaginalis.  We will send ThinPrep to evaluate for gonorrhea, chlamydia, Gardnerella vaginalis, trichomonas.  We will also check for syphilis and HIV.  Patient was counseled not to use any scented and chemical soaps in her vaginal area and to rather use products like Vagisil.  She was also told to use protection during every sexual encounter.  -hiv, rpr, urine gonorrhea and chlamydia  -urine hcg

## 2019-07-24 NOTE — Progress Notes (Signed)
ronic   CC: Vaginal discharge and itching  HPI:  [name] is a 24 y.o. with hypothyroidism, abnormal uterine bleeding who presents for vaginal discharge and itching. Please see problem based charting for evaluation, assessment, and plan.  Past Medical History:  Diagnosis Date  . Anxiety   . Chlamydia   . Depression    just recently, no meds. or tx  . Headache   . Sickle cell trait (HCC)   . Tooth pain 09/25/2018   Review of Systems:   Per hpi  Physical Exam:  Vitals:   07/24/19 0851  BP: 136/85  Pulse: 77  Temp: 98.5 F (36.9 C)  TempSrc: Oral  SpO2: 97%  Weight: 154 lb 14.4 oz (70.3 kg)  Height: 5\' 3"  (1.6 m)   Physical Exam  Constitutional: She appears well-developed and well-nourished. No distress.  Cardiovascular: Normal rate, regular rhythm and normal heart sounds.  Respiratory: Effort normal and breath sounds normal. No respiratory distress. She has no wheezes.  GI: Soft. Bowel sounds are normal. She exhibits no distension. There is no abdominal tenderness.  Genitourinary: There is no rash, tenderness, lesion or injury on the right labia. There is no rash, tenderness, lesion or injury on the left labia.    Vaginal discharge (White-colored) present.     No vaginal erythema or tenderness.  No erythema or tenderness in the vagina.    Genitourinary Comments: No cervical motion tenderness   Skin: She is not diaphoretic.  Psychiatric: She has a normal mood and affect. Her behavior is normal. Judgment and thought content normal.   Assessment & Plan:   See Encounters Tab for problem based charting.  Patient discussed with Dr. 

## 2019-07-25 ENCOUNTER — Other Ambulatory Visit: Payer: Self-pay | Admitting: Internal Medicine

## 2019-07-25 LAB — CERVICOVAGINAL ANCILLARY ONLY
Bacterial Vaginitis (gardnerella): POSITIVE — AB
Candida Glabrata: NEGATIVE
Candida Vaginitis: NEGATIVE
Chlamydia: NEGATIVE
Comment: NEGATIVE
Comment: NEGATIVE
Comment: NEGATIVE
Comment: NEGATIVE
Comment: NEGATIVE
Comment: NORMAL
Neisseria Gonorrhea: NEGATIVE
Trichomonas: POSITIVE — AB

## 2019-07-25 LAB — RPR: RPR Ser Ql: NONREACTIVE

## 2019-07-25 LAB — HIV ANTIBODY (ROUTINE TESTING W REFLEX): HIV Screen 4th Generation wRfx: NONREACTIVE

## 2019-07-25 MED ORDER — METRONIDAZOLE 500 MG PO TABS: 500.0000 mg | ORAL_TABLET | Freq: Two times a day (BID) | ORAL | 0 refills | Status: AC

## 2019-08-07 ENCOUNTER — Other Ambulatory Visit: Payer: Self-pay

## 2019-08-07 ENCOUNTER — Other Ambulatory Visit (HOSPITAL_COMMUNITY)
Admission: RE | Admit: 2019-08-07 | Discharge: 2019-08-07 | Disposition: A | Discharge status: Home / Self Care | Payer: Medicaid Other | Source: Ambulatory Visit | Attending: Internal Medicine | Admitting: Internal Medicine

## 2019-08-07 ENCOUNTER — Ambulatory Visit (INDEPENDENT_AMBULATORY_CARE_PROVIDER_SITE_OTHER): Payer: Self-pay | Admitting: Internal Medicine

## 2019-08-07 VITALS — BP 115/82 | HR 77 | Temp 98.4°F | Ht 63.0 in | Wt 151.7 lb

## 2019-08-07 DIAGNOSIS — N898 Other specified noninflammatory disorders of vagina: Secondary | ICD-10-CM | POA: Insufficient documentation

## 2019-08-07 NOTE — Progress Notes (Signed)
   CC: vaginal itching  HPI:  Bonnie Cooper is a 24 y.o. with history of hypothyroidism, major depressive disorder presenting with 1 week of vaginal itching on urination.  She denies any new lesions, rashes in the area.  She was previously seen in clinic for concerns of vaginal discharge and noted to have bacterial vaginosis and trichomoniasis for which she was prescribed metronidazole.  She notes that she has completed this course of antibiotics and her vaginal discharge has resolved, however continues to have vaginal itching with urination.  Denies any fevers chills, vaginal discharge, dysuria, change in urinary frequency, change in urine color, frothy urine, difficulty urinating.  Past Medical History:  Diagnosis Date  . Anxiety   . Chlamydia   . Depression    just recently, no meds. or tx  . Headache   . Sickle cell trait (HCC)   . Tooth pain 09/25/2018   Review of Systems:  Negative except as stated in HPI.  Physical Exam:  Vitals:   08/07/19 1403  BP: 115/82  Pulse: 77  Temp: 98.4 F (36.9 C)  TempSrc: Oral  SpO2: 100%  Weight: 151 lb 11.2 oz (68.8 kg)  Height: 5\' 3"  (1.6 m)   Physical Exam Abdominal:     General: Abdomen is flat. Bowel sounds are normal. There is no distension.     Palpations: Abdomen is soft. There is no mass.     Tenderness: There is no abdominal tenderness. There is no right CVA tenderness, left CVA tenderness, guarding or rebound.     Hernia: No hernia is present.  Genitourinary:    General: Normal vulva.     Pubic Area: No rash.      Labia:        Right: No rash or lesion.        Left: No rash or lesion.      Vagina: No vaginal discharge, erythema or tenderness.     Cervix: No cervical motion tenderness or discharge.     Adnexa: Right adnexa normal and left adnexa normal.       Right: No tenderness.         Left: No tenderness.    Skin:    General: Skin is warm and dry.     Capillary Refill: Capillary refill takes less than 2 seconds.       Assessment & Plan:   See Encounters Tab for problem based charting.  Patient discussed with Dr. 

## 2019-08-07 NOTE — Assessment & Plan Note (Addendum)
Patient presenting with ongoing vaginal itching with urination for past week.  She was evaluated on 5/13 for summer complaint and noted to have bacterial vaginosis and trichomoniasis for which she was treated with metronidazole.  Patient notes that she has completed her antibiotic therapy, however continues to have vaginal itching.  She denies any fevers, chills, dysuria, vaginal discharge, any lesions or rash in the vaginal area.  patient has not had any sexual intercourse for the past week.  She has not used any scented or chemical soaps in the vaginal area.  No change in laundry detergent.  Assessment/plan: On speculum examination, white-colored thick discharge in the vaginal canal.  Cervix visualized and does not appear to be erythematous or friable.  No significant CMT.  No lesions noted in the vulvar or labial region. Suspect ongoing BV, trichomoniasis, or candidal infection.  - BV, Trichomoniasis, Candida, Gonorrhea/Chl   Addendum: Noted to be BV positive.  Discussed with patient.  Will send prescription for metronidazole 500 mg twice daily for 7 days.  If continues to have recurrent BV, will consider metronidazole 0.75% gel for 7 to 10 days followed by twice weekly for 4 to 6 months.

## 2019-08-08 ENCOUNTER — Ambulatory Visit: Payer: Medicaid Other

## 2019-08-08 LAB — CERVICOVAGINAL ANCILLARY ONLY
Bacterial Vaginitis (gardnerella): POSITIVE — AB
Candida Glabrata: NEGATIVE
Candida Vaginitis: NEGATIVE
Chlamydia: NEGATIVE
Comment: NEGATIVE
Comment: NEGATIVE
Comment: NEGATIVE
Comment: NEGATIVE
Comment: NEGATIVE
Comment: NORMAL
Neisseria Gonorrhea: NEGATIVE
Trichomonas: NEGATIVE

## 2019-08-08 MED ORDER — METRONIDAZOLE 500 MG PO TABS: 500.0000 mg | ORAL_TABLET | Freq: Two times a day (BID) | ORAL | 0 refills | Status: AC

## 2019-08-08 MED FILL — metroNIDAZOLE 500 MG TABS: 500 | 7 days supply | Qty: 14 | Fill #0

## 2019-08-08 NOTE — Progress Notes (Signed)
Internal Medicine Clinic Attending  Case discussed with Dr. Aslam at the time of the visit.  We reviewed the resident's history and exam and pertinent patient test results.  I agree with the assessment, diagnosis, and plan of care documented in the resident's note.  

## 2019-08-08 NOTE — Addendum Note (Signed)
Addended by: Eliezer Bottom on: 08/08/2019 01:39 PM   Modules accepted: Orders

## 2019-11-28 ENCOUNTER — Other Ambulatory Visit: Payer: Self-pay | Admitting: Student

## 2019-11-28 ENCOUNTER — Ambulatory Visit: Payer: Self-pay | Admitting: Student

## 2019-11-28 ENCOUNTER — Other Ambulatory Visit (HOSPITAL_COMMUNITY)
Admission: RE | Admit: 2019-11-28 | Discharge: 2019-11-28 | Disposition: A | Discharge status: Home / Self Care | Payer: Medicaid Other | Source: Ambulatory Visit | Attending: Internal Medicine | Admitting: Internal Medicine

## 2019-11-28 ENCOUNTER — Other Ambulatory Visit: Payer: Self-pay

## 2019-11-28 ENCOUNTER — Encounter: Payer: Self-pay | Admitting: Student

## 2019-11-28 VITALS — BP 117/78 | HR 99 | Temp 98.4°F | Wt 154.6 lb

## 2019-11-28 DIAGNOSIS — R8290 Unspecified abnormal findings in urine: Secondary | ICD-10-CM

## 2019-11-28 DIAGNOSIS — Z113 Encounter for screening for infections with a predominantly sexual mode of transmission: Secondary | ICD-10-CM

## 2019-11-28 DIAGNOSIS — G4440 Drug-induced headache, not elsewhere classified, not intractable: Secondary | ICD-10-CM

## 2019-11-28 DIAGNOSIS — T3995XA Adverse effect of unspecified nonopioid analgesic, antipyretic and antirheumatic, initial encounter: Secondary | ICD-10-CM

## 2019-11-28 DIAGNOSIS — E039 Hypothyroidism, unspecified: Secondary | ICD-10-CM

## 2019-11-28 DIAGNOSIS — R8271 Bacteriuria: Secondary | ICD-10-CM

## 2019-11-28 DIAGNOSIS — A749 Chlamydial infection, unspecified: Secondary | ICD-10-CM

## 2019-11-28 DIAGNOSIS — B9689 Other specified bacterial agents as the cause of diseases classified elsewhere: Secondary | ICD-10-CM

## 2019-11-28 DIAGNOSIS — A549 Gonococcal infection, unspecified: Secondary | ICD-10-CM

## 2019-11-28 DIAGNOSIS — R519 Headache, unspecified: Secondary | ICD-10-CM

## 2019-11-28 DIAGNOSIS — E038 Other specified hypothyroidism: Secondary | ICD-10-CM

## 2019-11-28 DIAGNOSIS — E063 Autoimmune thyroiditis: Secondary | ICD-10-CM

## 2019-11-28 DIAGNOSIS — Z3202 Encounter for pregnancy test, result negative: Secondary | ICD-10-CM

## 2019-11-28 DIAGNOSIS — N760 Acute vaginitis: Secondary | ICD-10-CM

## 2019-11-28 LAB — POCT URINALYSIS DIPSTICK
Bilirubin, UA: NEGATIVE
Blood, UA: NEGATIVE
Glucose, UA: NEGATIVE
Leukocytes, UA: NEGATIVE
Nitrite, UA: POSITIVE
Protein, UA: NEGATIVE
Spec Grav, UA: 1.02 (ref 1.010–1.025)
Urobilinogen, UA: 1 E.U./dL
pH, UA: 8.5 — AB (ref 5.0–8.0)

## 2019-11-28 LAB — POCT URINE PREGNANCY: Preg Test, Ur: NEGATIVE

## 2019-11-28 NOTE — Addendum Note (Signed)
Addended by: Bufford Spikes on: 11/28/2019 04:15 PM   Modules accepted: Orders

## 2019-11-28 NOTE — Assessment & Plan Note (Signed)
Patient had TSH of 7.7 followed by the initiation of levothyroxine daily. She discontinued this medication several months ago due to feeling well and did not desire repeat thyroid function testing. She denies any symptoms concerning for hypothyroidism at this time. However, given her history, it is reasonable to obtain a repeat TSH and Free T4 to determine the current state of this chronic medical condition. -TSH -Free T4

## 2019-11-28 NOTE — Addendum Note (Signed)
Addended by: Bufford Spikes on: 11/28/2019 04:14 PM   Modules accepted: Orders

## 2019-11-28 NOTE — Assessment & Plan Note (Addendum)
Patient reports daily, unilateral, severe headaches. She states that she previously was taking multiple Excedrin a day which previously alleviated her symptoms, however the medication stopped working a few months back. She now takes 4 tablets of ibuprofen once or twice a day which is not always effective at alleviating her headaches. She states that the headaches are associated with photophobia, but no phonophobia, and no preceeding aurua. She denies any associated nausea, vomiting, or blurry vision.   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 She would likely benefit from a trial of discontinuing all analgesics for some time and working through the headaches with non-medication management.  PLAN: -Discontinue all analgesics for headaches -Apply a cold rag as needed -Turn lights off, quiet room -Sleep off headaches in the evenings. -If headaches do not improve after a trial of withdrawal from medications, we can consider starting Imitrex.

## 2019-11-28 NOTE — Patient Instructions (Signed)
Dear Ms. Bumpus,  It was a pleasure meeting you today.  For your headaches, we feel that these episodes may be because of a condition known as analgesic overuse headaches. Please try to not take any analgesics for your headaches for at least the next few weeks. Instead, you can apply a cold rag to your forehead, turn off the lights and loud sounds, lay in a quiet room, and try to sleep off the headaches. If they do not improve over a few weeks of no analgesic use, then we may consider starting another medication.  For your STD screening, I will call you with the results.  For your thyroid studies, it is reassuring that you don't currently have any symptoms, but we will check these labs to see how your thyroid condition is progressing. I will call you with the results.  If you have any new symptoms/concerns please don't hesitate to contact our clinic, or if your symptoms are severe, present to the emergency department or urgent care.  Sincerely, Despina Hidden, MD

## 2019-11-28 NOTE — Progress Notes (Signed)
   CC: STD screening  HPI:  Bonnie Cooper is a 24 y.o. with past medical history of uncontrolled hypothyroidism, anxiety/depression who presents to clinic today for STD screening and headache. Please, refer to Problem List for Assessment & Plan based charting of patient's chronic medical conditions as well as any acute complaints addressed at today's visit.  Past Medical History:  Diagnosis Date  . Anxiety   . Chlamydia   . Depression    just recently, no meds. or tx  . Headache   . Sickle cell trait (HCC)   . Tooth pain 09/25/2018   History reviewed. No pertinent family history.  Social History   Tobacco Use  . Smoking status: Never Smoker  . Smokeless tobacco: Never Used  Vaping Use  . Vaping Use: Every day  . Substances: Flavoring  . Devices: Hookah on occaision   Substance Use Topics  . Alcohol use: Yes    Comment: SOCIALLY   . Drug use: Not Currently    Types: Marijuana   Review of Systems:  Endorses headaches. Denies nausea, vomiting, abdominal pain, blurry vision, vaginal burning/itching/discharge.  Physical Exam:  Vitals:   11/28/19 1350  BP: 117/78  Pulse: 99  Temp: 98.4 F (36.9 C)  TempSrc: Oral  SpO2: 99%  Weight: 154 lb 9.6 oz (70.1 kg)   Physical Exam Constitutional:      General: She is not in acute distress.    Appearance: Normal appearance.  HENT:     Head: Normocephalic and atraumatic.  Eyes:     Extraocular Movements: Extraocular movements intact.     Conjunctiva/sclera: Conjunctivae normal.  Cardiovascular:     Rate and Rhythm: Normal rate and regular rhythm.     Pulses: Normal pulses.     Heart sounds: Normal heart sounds.  Pulmonary:     Effort: Pulmonary effort is normal.     Breath sounds: Normal breath sounds.  Abdominal:     General: Abdomen is flat. Bowel sounds are normal.     Palpations: Abdomen is soft.  Musculoskeletal:        General: Normal range of motion.     Cervical back: Normal range of motion and neck supple.    Skin:    General: Skin is warm and dry.     Capillary Refill: Capillary refill takes less than 2 seconds.  Neurological:     General: No focal deficit present.     Mental Status: She is alert. Mental status is at baseline.  Psychiatric:        Mood and Affect: Mood normal.        Behavior: Behavior normal.        Thought Content: Thought content normal.        Judgment: Judgment normal.    Assessment & Plan:   See Encounters Tab for problem based charting.  Patient seen with Dr. Mikey Bussing

## 2019-11-28 NOTE — Assessment & Plan Note (Addendum)
Patient is currently sexually active with one female partner, with only occassional condom use for protection. She currently denies vaginal burning, itching, or discharge. She denies any known positive contacts. She is interested in obtaining STD testing today. Her last menstrual period was three weeks ago. -Urine gonorrhea/chlamydia -Cervicovaginal swab for trichomonas, BV, and candida -UA -Urine pregnancy test  ADDENDUM: Patient tested positive for gonorrhea, chlamydia, and bacterial vaginosis. See other problems.

## 2019-11-29 LAB — MICROSCOPIC EXAMINATION
Casts: NONE SEEN /LPF
RBC, Urine: NONE SEEN /HPF (ref 0–2)

## 2019-11-29 LAB — RPR: RPR Ser Ql: NONREACTIVE

## 2019-11-29 LAB — URINALYSIS, COMPLETE
Bilirubin, UA: NEGATIVE
Glucose, UA: NEGATIVE
Ketones, UA: NEGATIVE
Nitrite, UA: POSITIVE — AB
Protein,UA: NEGATIVE
RBC, UA: NEGATIVE
Specific Gravity, UA: 1.019 (ref 1.005–1.030)
Urobilinogen, Ur: 1 mg/dL (ref 0.2–1.0)
pH, UA: 8.5 — ABNORMAL HIGH (ref 5.0–7.5)

## 2019-11-29 LAB — TSH: TSH: 2.91 u[IU]/mL (ref 0.450–4.500)

## 2019-11-29 LAB — HIV ANTIBODY (ROUTINE TESTING W REFLEX): HIV Screen 4th Generation wRfx: NONREACTIVE

## 2019-11-29 LAB — T4, FREE: Free T4: 0.8 ng/dL — ABNORMAL LOW (ref 0.82–1.77)

## 2019-12-01 ENCOUNTER — Telehealth: Payer: Self-pay | Admitting: Student

## 2019-12-01 ENCOUNTER — Other Ambulatory Visit: Payer: Self-pay | Admitting: Student

## 2019-12-01 DIAGNOSIS — B9689 Other specified bacterial agents as the cause of diseases classified elsewhere: Secondary | ICD-10-CM

## 2019-12-01 DIAGNOSIS — A749 Chlamydial infection, unspecified: Secondary | ICD-10-CM | POA: Insufficient documentation

## 2019-12-01 DIAGNOSIS — R8271 Bacteriuria: Secondary | ICD-10-CM | POA: Insufficient documentation

## 2019-12-01 DIAGNOSIS — A549 Gonococcal infection, unspecified: Secondary | ICD-10-CM | POA: Insufficient documentation

## 2019-12-01 LAB — CERVICOVAGINAL ANCILLARY ONLY
Bacterial Vaginitis (gardnerella): POSITIVE — AB
Candida Glabrata: NEGATIVE
Candida Vaginitis: NEGATIVE
Comment: NEGATIVE
Comment: NEGATIVE
Comment: NEGATIVE
Comment: NEGATIVE
Trichomonas: NEGATIVE

## 2019-12-01 LAB — URINE CYTOLOGY ANCILLARY ONLY
Chlamydia: POSITIVE — AB
Comment: NEGATIVE
Comment: NORMAL
Neisseria Gonorrhea: POSITIVE — AB

## 2019-12-01 MED ORDER — METRONIDAZOLE 0.75 % VA GEL: 1.0000 | VAGINAL | 0 refills | Status: DC

## 2019-12-01 MED ORDER — METRONIDAZOLE 500 MG PO TABS: 500.0000 mg | ORAL_TABLET | Freq: Two times a day (BID) | ORAL | 0 refills | Status: DC

## 2019-12-01 MED ORDER — DOXYCYCLINE MONOHYDRATE 100 MG PO TABS: 100.0000 mg | ORAL_TABLET | Freq: Two times a day (BID) | ORAL | 0 refills | Status: DC

## 2019-12-01 MED ORDER — CEFTRIAXONE SODIUM 500 MG IJ SOLR: 500.0000 mg | Freq: Once | INTRAMUSCULAR | Status: DC

## 2019-12-01 NOTE — Assessment & Plan Note (Addendum)
Patient tested positive for gonorrhea on urine cytology.  -Schedule nurse visit as soon as possible for administration of 500mg  IM ceftriaxone -Encourage partner to be evaluated and treated

## 2019-12-01 NOTE — Telephone Encounter (Signed)
Contacted Ms. Garrod by telephone to discuss laboratory results. Informed her of testing positive to chlamydia, gonorrhea, bacterial vaginosis as well as having E. Coli in her urine. We discussed the need for her to come to clinic to receive intramuscular injection of ceftriaxone, as well as the need to pickup her prescriptions for doxycycline and metronidazole from the [facility]. Discussed with patient the medication regimen and the need for her partner to be evaluated and treated. Also discussed with patient the need to abstain from sexual contact or wear condoms for at least one week following treatment. She understands our plan and has no further questions.

## 2019-12-01 NOTE — Assessment & Plan Note (Signed)
Patient with bacterial vaginosis noted on vaginal swab. This is her third positive test within six months. Given the recurrence of this infection despite treatment with metronidazole, she requires escalation of her medication therapy. -Metronidazole 500mg  twice daily for seven days -Followed by metronidazole 0.75% vaginal gel twice weekly for 4-6 months

## 2019-12-01 NOTE — Assessment & Plan Note (Addendum)
Patient tested positive for chlamydia on urine cytology. -Doxycycline 100mg  twice daily for seven days -Encourage partner to be evaluated and treated

## 2019-12-01 NOTE — Assessment & Plan Note (Signed)
Patient grew >100,000 cfu of e. Coli on urine culture. UA reveals alkaline pH and positive nitrites. Given that she does have not have symptoms of UTI, we will hold off on prescribing antibiotics specifically for this colonization. However, she is still receiving ceftriaxone for gonorrhea which should provide some coverage. -Ceftriaxone 500mg  IM -Confirm with patient that she is not having new symptoms since office visit

## 2019-12-02 ENCOUNTER — Other Ambulatory Visit: Payer: Self-pay

## 2019-12-02 ENCOUNTER — Ambulatory Visit (INDEPENDENT_AMBULATORY_CARE_PROVIDER_SITE_OTHER): Payer: Self-pay | Admitting: *Deleted

## 2019-12-02 ENCOUNTER — Other Ambulatory Visit: Payer: Self-pay | Admitting: Student

## 2019-12-02 ENCOUNTER — Encounter: Payer: Self-pay | Admitting: *Deleted

## 2019-12-02 DIAGNOSIS — A549 Gonococcal infection, unspecified: Secondary | ICD-10-CM

## 2019-12-02 DIAGNOSIS — N760 Acute vaginitis: Secondary | ICD-10-CM

## 2019-12-02 DIAGNOSIS — B9689 Other specified bacterial agents as the cause of diseases classified elsewhere: Secondary | ICD-10-CM

## 2019-12-02 DIAGNOSIS — A749 Chlamydial infection, unspecified: Secondary | ICD-10-CM

## 2019-12-02 LAB — URINE CULTURE

## 2019-12-02 MED ORDER — DOXYCYCLINE MONOHYDRATE 100 MG PO TABS: 100.0000 mg | ORAL_TABLET | Freq: Two times a day (BID) | ORAL | 0 refills | Status: AC

## 2019-12-02 MED ORDER — METRONIDAZOLE 0.75 % VA GEL: 1.0000 | VAGINAL | 0 refills | Status: DC

## 2019-12-02 MED ORDER — CEFTRIAXONE SODIUM 500 MG IJ SOLR: 500.0000 mg | Freq: Once | INTRAMUSCULAR | Status: DC

## 2019-12-02 MED ORDER — METRONIDAZOLE 500 MG PO TABS: 500.0000 mg | ORAL_TABLET | Freq: Two times a day (BID) | ORAL | 0 refills | Status: AC

## 2019-12-02 MED ORDER — CEFTRIAXONE SODIUM 1 G IJ SOLR
0.5000 g | Freq: Once | INTRAMUSCULAR | Status: AC
  Administered 2019-12-02: 0.5 g via INTRAMUSCULAR

## 2019-12-02 MED FILL — DOXYCYCLINE MONO 100 MG TAB: 100 | 7 days supply | Qty: 14 | Fill #0

## 2019-12-02 MED FILL — metroNIDAZOLE 500 MG TABS: 500 | 7 days supply | Qty: 14 | Fill #0

## 2019-12-02 MED FILL — metroNIDAZOLE 0.75 % GEL: 0.75 | 30 days supply | Qty: 70 | Fill #0

## 2019-12-02 NOTE — Progress Notes (Signed)
12-02-2019  Confidential Communicable Disease Report faxed to the [place] at [phone] 12-02-2019 09:02  Maryan Rued, PBT 09:03  [id number]

## 2019-12-02 NOTE — Progress Notes (Signed)
   Patient presents for Rocephin 500 mg IM. Tolerated well. Information given on chlamydia and gonorrhea. Offered condoms which patient declined. States partner is aware that he will need to be treated as well. Patient aware that she may not consume alcohol for duration of treatment with flagyl and 3 days following. Kinnie Feil, BSN, RN-BC

## 2019-12-02 NOTE — Patient Instructions (Signed)
When taking Flagyl, please do not consume alcohol for duration of treatment and 3 days following.  Chlamydia, Female  Chlamydia is a STD (sexually transmitted disease). This is an infection that spreads through sexual contact. If it is not treated, it can cause serious problems. It must be treated with antibiotic medicine. If this infection is not treated and you are pregnant or become pregnant, your baby could get it during delivery. This may cause bad health problems for the baby. Sometimes, you may not have symptoms (asymptomatic). When you have symptoms, they can include:  Burning when you pee (urinate).  Peeing often.  Fluid (discharge) coming from the vagina.  Redness, soreness, and swelling (inflammation) of the butt (rectum).  Bleeding or fluid coming from the butt.  Belly (abdominal) pain.  Pain during sex.  Bleeding between periods.  Itching, burning, or redness in the eyes.  Fluid coming from the eyes. Follow these instructions at home: Medicines  Take over-the-counter and prescription medicines only as told by your doctor.  Take your antibiotic medicine as told by your doctor. Do not stop taking the antibiotic even if you start to feel better. Sexual activity  Tell sex partners about your infection. Sex partners are people you had oral, anal, or vaginal sex with within 60 days of when you started getting sick. They need treatment, too.  Do not have sex until: ? You and your sex partners have been treated. ? Your doctor says it is okay.  If you have a single dose treatment, wait 7 days before having sex. General instructions  It is up to you to get your test results. Ask your doctor when your results will be ready.  Get a lot of rest.  Eat healthy foods.  Drink enough fluid to keep your pee (urine) clear or pale yellow.  Keep all follow-up visits as told by your doctor. You may need tests after 3 months. Preventing chlamydia  The only way to prevent  chlamydia is not to have sex. To lower your risk: ? Use latex condoms correctly. Do this every time you have sex. ? Avoid having many sex partners. ? Ask if your partner has been tested for STDs and if he or she had negative results. Contact a doctor if:  You get new symptoms.  You do not get better with treatment.  You have a fever or chills.  You have pain during sex. Get help right away if:  Your pain gets worse and does not get better with medicine.  You get flu-like symptoms, such as: ? Night sweats. ? Sore throat. ? Muscle aches.  You feel sick to your stomach (nauseous).  You throw up (vomit).  You have trouble swallowing.  You have bleeding: ? Between periods. ? After sex.  You have irregular periods.  You have belly pain that does not get better with medicine.  You have lower back pain that does not get better with medicine.  You feel weak or dizzy.  You pass out (faint).  You are pregnant and you get symptoms of chlamydia. Summary  Chlamydia is an infection that spreads through sexual contact.  Sometimes, chlamydia can cause no symptoms (asymptomatic).  Do not have sex until your doctor says it is okay.  All sex partners will have to be treated for chlamydia. This information is not intended to replace advice given to you by your health care provider. Make sure you discuss any questions you have with your health care provider. Document Revised: 08/21/2017  Document Reviewed: 02/17/2016 Elsevier Patient Education  [employer].   Gonorrhea Gonorrhea is a sexually transmitted disease (STD) that can affect both men and women. If left untreated, this infection can:  Damage the female or female organs.  Cause women and men to be unable to have children (be sterile).  Harm a fetus if an infected woman is pregnant. It is important to get treatment for gonorrhea as soon as possible. It is also necessary for all of your sexual partners to be tested  for the infection. What are the causes? This condition is caused by bacteria called Neisseria gonorrhoeae. The infection is spread from person to person through sexual contact, including oral, anal, and vaginal sex. A newborn can contract the infection from his or her mother during birth. What increases the risk? The following factors may make you more likely to develop this condition:  Being a woman who is younger than 25 years of age and who is sexually active.  Being a woman 37 years of age or older who has: ? A new sex partner. ? More than one sex partner. ? A sex partner who has an STD.  Being a man who has: ? A new sex partner. ? More than one sex partner. ? A sex partner who has an STD.  Using condoms inconsistently.  Currently having, or having previously had, an STD.  Exchanging sex for money or drugs. What are the signs or symptoms? Some people do not have any symptoms. If you do have symptoms, they may be different for females and males. For females  Pain in the lower abdomen.  Abnormal vaginal discharge. The discharge may be cloudy, thick, or yellow-green in color.  Bleeding between periods.  Painful sex.  Burning or itching in and around the vagina.  Pain or burning when urinating.  Irritation, pain, bleeding, or discharge from the rectum. This may occur if the infection was spread by anal sex.  Sore throat or swollen lymph nodes in the neck. This may occur if the infection was spread by oral sex. For males  Abnormal discharge from the penis. This discharge may be cloudy, thick, or yellow-green in color.  Pain or burning during urination.  Pain or swelling in the testicles.  Irritation, pain, bleeding, or discharge from the rectum. This may occur if the infection was spread by anal sex.  Sore throat, fever, or swollen lymph nodes in the neck. This may occur if the infection was spread by oral sex. How is this diagnosed? This condition is diagnosed  based on:  A physical exam.  A sample of discharge that is examined under a microscope to look for the bacteria. The discharge may be taken from the urethra, cervix, throat, or rectum.  Urine tests. Not all of test results will be available during your visit. How is this treated? This condition is treated with antibiotic medicines. It is important for treatment to begin as soon as possible. Early treatment may prevent some problems from developing. Do not have sex during treatment. Avoid all types of sexual activity for 7 days after treatment is complete and until any sex partners have been treated. Follow these instructions at home:  Take over-the-counter and prescription medicines only as told by your health care provider.  Take your antibiotic medicine as told by your health care provider. Do not stop taking the antibiotic even if you start to feel better.  Do not have sex until at least 7 days after you and your  partner(s) have finished treatment and your health care provider says it is okay.  It is your responsibility to get your test results. Ask your health care provider, or the department performing the test, when your results will be ready.  If you test positive for gonorrhea, inform your recent sexual partners. This includes any oral, anal, or vaginal sex partners. They need to be checked for gonorrhea even if they do not have symptoms. They may need treatment, even if they test negative for gonorrhea.  Keep all follow-up visits as told by your health care provider. This is important. How is this prevented?   Use latex condoms correctly every time you have sexual intercourse.  Ask if your sexual partner has been tested for STDs and had negative results.  Avoid having multiple sexual partners. Contact a health care provider if:  You develop a bad reaction to the medicine you were prescribed. This may include: ? A rash. ? Nausea. ? Vomiting. ? Diarrhea.  Your symptoms  do not get better after a few days of taking antibiotics.  Your symptoms get worse.  You develop new symptoms.  Your pain gets worse.  You have a fever.  You develop pain, itching, or discharge around the eyes. Get help right away if:  You feel dizzy or faint.  You have trouble breathing or have shortness of breath.  You develop an irregular heartbeat.  You have severe abdominal pain with or without shoulder pain.  You develop any bumps or sores (lesions) on your skin.  You develop warmth, redness, pain, or swelling around your joints, such as the knee. Summary  Gonorrhea is an STD that can affect both men and women.  This condition is caused by bacteria called Neisseria gonorrhoeae. The infection is spread from person to person, usually through sexual contact, including oral, anal, and vaginal sex.  Symptoms vary between males and females. Generally, they include abnormal discharge and burning during urination. Women may also experience painful sex, itching around the vagina, and bleeding between menstrual periods. Men may also experience swelling of the testicles.  This condition is treated with antibiotic medicines. Do not have sex until at least 7 days after completing antibiotic treatment.  If left untreated, gonorrhea can have serious side effects and complications. This information is not intended to replace advice given to you by your health care provider. Make sure you discuss any questions you have with your health care provider. Document Revised: 07/23/2018 Document Reviewed: 01/28/2016 Elsevier Patient Education  2020 [employer].

## 2019-12-03 NOTE — Progress Notes (Signed)
Internal Medicine Clinic Attending  I saw and evaluated the patient.  I personally confirmed the key portions of the history and exam documented by Dr. Johnson and I reviewed pertinent patient test results.  The assessment, diagnosis, and plan were formulated together and I agree with the documentation in the resident's note.  

## 2019-12-22 ENCOUNTER — Telehealth: Payer: Self-pay | Admitting: Student

## 2019-12-22 NOTE — Telephone Encounter (Signed)
Rosemary Holms, PAS, states she told patient to call the [facility] directly to schedule an appt as she had already been referred there once but had declined at that time because she could not afford the co-pay. Since patient indicated to Ms Anne Hahn she could now provide the co-pay, Ms Anne Hahn instructed her to follow through with her existing previous referral to call for an appt. Dorie Rank, RN, for Rosemary Holms, PAS,  12/22/19, 3:25P

## 2019-12-22 NOTE — Telephone Encounter (Signed)
Pt is want a dentist referral for [facility] [phone]

## 2020-01-08 ENCOUNTER — Ambulatory Visit: Payer: Self-pay | Admitting: Student

## 2020-01-08 ENCOUNTER — Other Ambulatory Visit (HOSPITAL_COMMUNITY)
Admission: RE | Admit: 2020-01-08 | Discharge: 2020-01-08 | Disposition: A | Discharge status: Home / Self Care | Payer: Medicaid Other | Source: Ambulatory Visit | Attending: Internal Medicine | Admitting: Internal Medicine

## 2020-01-08 VITALS — BP 143/97 | HR 74 | Temp 98.7°F | Wt 154.8 lb

## 2020-01-08 DIAGNOSIS — B9689 Other specified bacterial agents as the cause of diseases classified elsewhere: Secondary | ICD-10-CM | POA: Insufficient documentation

## 2020-01-08 DIAGNOSIS — Z113 Encounter for screening for infections with a predominantly sexual mode of transmission: Secondary | ICD-10-CM | POA: Diagnosis not present

## 2020-01-08 DIAGNOSIS — N898 Other specified noninflammatory disorders of vagina: Secondary | ICD-10-CM | POA: Insufficient documentation

## 2020-01-08 DIAGNOSIS — B373 Candidiasis of vulva and vagina: Secondary | ICD-10-CM | POA: Diagnosis not present

## 2020-01-08 DIAGNOSIS — N760 Acute vaginitis: Secondary | ICD-10-CM

## 2020-01-08 DIAGNOSIS — B3749 Other urogenital candidiasis: Secondary | ICD-10-CM

## 2020-01-08 DIAGNOSIS — Z3202 Encounter for pregnancy test, result negative: Secondary | ICD-10-CM

## 2020-01-08 LAB — POCT URINE PREGNANCY: Preg Test, Ur: NEGATIVE

## 2020-01-08 NOTE — Patient Instructions (Addendum)
It was a pleasure seeing you in clinic. Today we discussed:   Vaginal itching: I have ordered tests for STIs, pregnancy, and urine tests and will message you with the results. Please try to use condoms in the future and discuss with future partners to help prevent future infections.  Please follow up in a few months to have a Pap smear done.   If you have any questions or concerns, please call our clinic at [phone] between 9am-5pm and after hours call [phone] and ask for the internal medicine resident on call. If you feel you are having a medical emergency please call 911.   Thank you, we look forward to helping you remain healthy!

## 2020-01-09 LAB — URINALYSIS, ROUTINE W REFLEX MICROSCOPIC
Bilirubin, UA: NEGATIVE
Glucose, UA: NEGATIVE
Ketones, UA: NEGATIVE
Nitrite, UA: POSITIVE — AB
Protein,UA: NEGATIVE
RBC, UA: NEGATIVE
Specific Gravity, UA: 1.016 (ref 1.005–1.030)
Urobilinogen, Ur: 0.2 mg/dL (ref 0.2–1.0)
pH, UA: 5 (ref 5.0–7.5)

## 2020-01-09 LAB — CERVICOVAGINAL ANCILLARY ONLY
Bacterial Vaginitis (gardnerella): POSITIVE — AB
Candida Glabrata: NEGATIVE
Candida Vaginitis: POSITIVE — AB
Chlamydia: NEGATIVE
Comment: NEGATIVE
Comment: NEGATIVE
Comment: NEGATIVE
Comment: NEGATIVE
Comment: NEGATIVE
Comment: NORMAL
Neisseria Gonorrhea: NEGATIVE
Trichomonas: NEGATIVE

## 2020-01-09 LAB — URINE CYTOLOGY ANCILLARY ONLY
Bacterial Vaginitis-Urine: POSITIVE — AB
Candida Urine: POSITIVE — AB
Chlamydia: NEGATIVE
Comment: NEGATIVE
Comment: NEGATIVE
Comment: NORMAL
Neisseria Gonorrhea: NEGATIVE
Trichomonas: NEGATIVE

## 2020-01-09 LAB — MICROSCOPIC EXAMINATION
Casts: NONE SEEN /lpf
Epithelial Cells (non renal): >10 /hpf — AB (ref 0–10)

## 2020-01-09 NOTE — Assessment & Plan Note (Addendum)
Patient presenting for vaginal itching for the past 2 days. She has presented to the clinic for similar symptoms several time this year. Most recently treated for BV, gonorrhea, and chlamydia on 12/01/2019. She notes she completed her previous course of antibiotic therapy and was feeling well until 2 days ago. She has been sexually active with the same female partner as before and did not use condom about 1 week ago. She not had intercourse for the past week and is no longer contacting partner. She denies fever, chills, dysuria, vaginal discharge, rashes, lesion, sore throat and pelvic pain. On exam she is febrile without abdominal or pelvic pain, no cervical lymphadenoma, or pharyngeal erythema or discharge. Pelvic exam deferred today. Advised she use condoms during intercourse to prevent future STIs. Suspect BV, gonorrhea, and chlaymdia given recent past history with same female partner.   Plan  Urine gonorrhea/chlaymdia Cervicovaginal swab for BV, trich, and candida Urine pregnancy UA  Addendum Positive for BV and candida, has had multiple positive tests for BV despite treatment with oral and topical metronidazole, will treat with topical clindamycin for 7 days and single dose fluconazole 150 mg for candida  Urine culture with gram negative rods likely E. Coli., will hold off on treatment as she continue to be asymptomatic

## 2020-01-09 NOTE — Progress Notes (Signed)
   CC: vaginal puritis  HPI:  [name] is a 24 y.o. F with history documented below presents for 2 days of vaginal itching. Please refer to problem based charting for further details and assessment and plan of current problem and chronic medical conditions.   Past Medical History:  Diagnosis Date  . Anxiety   . Chlamydia   . Depression    just recently, no meds. or tx  . Headache   . Sickle cell trait (HCC)   . Tooth pain 09/25/2018   Review of Systems:  Negative as per HPI  Physical Exam:  Vitals:   01/08/20 0950  BP: (!) 143/97  Pulse: 74  Temp: 98.7 F (37.1 C)  TempSrc: Oral  SpO2: 100%  Weight: 154 lb 12.8 oz (70.2 kg)   Physical Exam Constitutional:      Appearance: Normal appearance.  HENT:     Head: Normocephalic and atraumatic.     Mouth/Throat:     Mouth: Mucous membranes are moist.     Pharynx: Oropharynx is clear.  Eyes:     Extraocular Movements: Extraocular movements intact.     Pupils: Pupils are equal, round, and reactive to light.  Cardiovascular:     Rate and Rhythm: Normal rate and regular rhythm.     Pulses: Normal pulses.     Heart sounds: Normal heart sounds.  Pulmonary:     Effort: Pulmonary effort is normal.     Breath sounds: Normal breath sounds.  Abdominal:     General: Abdomen is flat. Bowel sounds are normal.     Palpations: Abdomen is soft.     Tenderness: There is no abdominal tenderness.  Musculoskeletal:        General: No swelling or tenderness. Normal range of motion.     Cervical back: Normal range of motion and neck supple.  Lymphadenopathy:     Cervical: No cervical adenopathy.  Skin:    General: Skin is warm and dry.     Capillary Refill: Capillary refill takes less than 2 seconds.  Neurological:     General: No focal deficit present.     Mental Status: She is alert. Mental status is at baseline.  Psychiatric:        Mood and Affect: Mood normal.        Behavior: Behavior normal.      Assessment &  Plan:   See Encounters Tab for problem based charting.  Patient seen with Dr. Criselda Peaches

## 2020-01-12 NOTE — Progress Notes (Signed)
Internal Medicine Clinic Attending  I saw and evaluated the patient.  I personally confirmed the key portions of the history and exam documented by Dr. Liang and I reviewed pertinent patient test results.  The assessment, diagnosis, and plan were formulated together and I agree with the documentation in the resident's note.  

## 2020-01-13 ENCOUNTER — Other Ambulatory Visit: Payer: Self-pay | Admitting: Student

## 2020-01-13 ENCOUNTER — Telehealth: Payer: Self-pay

## 2020-01-13 MED ORDER — FLUCONAZOLE 150 MG PO TABS: 150.0000 mg | ORAL_TABLET | Freq: Once | ORAL | 0 refills | Status: DC

## 2020-01-13 MED ORDER — CLINDAMYCIN PHOSPHATE 2 % VA CREA: 1.0000 | TOPICAL_CREAM | Freq: Every day | VAGINAL | 0 refills | Status: DC

## 2020-01-13 MED ORDER — CLINDAMYCIN PHOSPHATE 2 % VA CREA: 1.0000 | TOPICAL_CREAM | Freq: Every day | VAGINAL | 0 refills | Status: AC

## 2020-01-13 MED FILL — FLUCONAZOLE 150 MG TABS: 150 | 1 days supply | Qty: 1 | Fill #0

## 2020-01-13 NOTE — Addendum Note (Signed)
Addended by: Quincy Simmonds on: 01/13/2020 03:40 PM   Modules accepted: Orders

## 2020-01-13 NOTE — Addendum Note (Signed)
Addended by: Quincy Simmonds on: 01/13/2020 01:01 PM   Modules accepted: Orders

## 2020-01-13 NOTE — Telephone Encounter (Signed)
Called patient and she says she is able to afford medication with goodrx if sent to [facility] (is about $30). I have resent the script to her pharmacy of choice. If she continues to have issues can consider trying her on metronidazole oral and gel that she was on previously.

## 2020-01-13 NOTE — Addendum Note (Signed)
Addended by: Quincy Simmonds on: 01/13/2020 10:30 AM   Modules accepted: Orders

## 2020-01-13 NOTE — Telephone Encounter (Signed)
Received TC from patient who states she picked up the diflucan RX, but the clindamycin 2% vaginal cream is not on the $4 list and was over $100, which she cannot afford.  Asking if there is something else that can be called in. RX was sent by Dr. Elaina Pattee today. Will forward to Dr. Elaina Pattee, as well as, red team. Penne Lash, RN,BSN

## 2020-01-15 LAB — URINE CULTURE

## 2020-03-03 ENCOUNTER — Ambulatory Visit (INDEPENDENT_AMBULATORY_CARE_PROVIDER_SITE_OTHER): Payer: Self-pay | Admitting: Student

## 2020-03-03 ENCOUNTER — Other Ambulatory Visit (HOSPITAL_COMMUNITY)
Admission: RE | Admit: 2020-03-03 | Discharge: 2020-03-03 | Disposition: A | Discharge status: Home / Self Care | Payer: Medicaid Other | Source: Ambulatory Visit | Attending: Student in an Organized Health Care Education/Training Program | Admitting: Student in an Organized Health Care Education/Training Program

## 2020-03-03 ENCOUNTER — Encounter: Payer: Self-pay | Admitting: Student

## 2020-03-03 ENCOUNTER — Other Ambulatory Visit: Payer: Self-pay | Admitting: Student

## 2020-03-03 VITALS — BP 142/87 | HR 73 | Temp 98.6°F | Wt 154.9 lb

## 2020-03-03 DIAGNOSIS — N898 Other specified noninflammatory disorders of vagina: Secondary | ICD-10-CM

## 2020-03-03 DIAGNOSIS — Z113 Encounter for screening for infections with a predominantly sexual mode of transmission: Secondary | ICD-10-CM | POA: Diagnosis not present

## 2020-03-03 DIAGNOSIS — G43019 Migraine without aura, intractable, without status migrainosus: Secondary | ICD-10-CM

## 2020-03-03 DIAGNOSIS — B373 Candidiasis of vulva and vagina: Secondary | ICD-10-CM | POA: Insufficient documentation

## 2020-03-03 MED ORDER — SUMATRIPTAN SUCCINATE 50 MG PO TABS
50.0000 mg | ORAL_TABLET | ORAL | 0 refills | Status: DC | PRN
Start: 2020-03-03 — End: 2020-03-03

## 2020-03-03 MED ORDER — PROPRANOLOL HCL ER 60 MG PO CP24: 60.0000 mg | ORAL_CAPSULE | Freq: Every day | ORAL | 0 refills | Status: DC

## 2020-03-03 MED ORDER — PROPRANOLOL HCL ER 60 MG PO CP24
60.0000 mg | ORAL_CAPSULE | Freq: Every day | ORAL | 0 refills | Status: DC
Start: 2020-03-03 — End: 2020-03-03

## 2020-03-03 MED ORDER — SUMATRIPTAN SUCCINATE 50 MG PO TABS: 50.0000 mg | ORAL_TABLET | ORAL | 0 refills | Status: DC | PRN

## 2020-03-03 MED FILL — SUMAtriptan SUCCINATE 50 MG: 50 | 30 days supply | Qty: 9 | Fill #0

## 2020-03-03 MED FILL — PROPRANOLOL HCL ER 60 MG CP: 60 | 30 days supply | Qty: 30 | Fill #0

## 2020-03-03 NOTE — Progress Notes (Signed)
   CC: Follow-up  HPI:  Bonnie Cooper is a 24 y.o. with recurrent bacterial vaginosis, prior gonorrhea and chlamydia infection, and chronic migraines who presents to clinic for evaluation. Refer to problem list for Assessment/Plan based charting of this encounter.   Past Medical History:  Diagnosis Date  . Anxiety   . Chlamydia   . Depression    just recently, no meds. or tx  . Headache   . Sickle cell trait (HCC)   . Tooth pain 09/25/2018   Review of Systems:  Endorses vaginal itching and redness of labia. Endorses headaches. Denies vaginal discharge, fevers, chills, lightheadedness, dizziness.  Physical Exam:  Vitals:   03/03/20 1521  BP: (!) 142/87  Pulse: 73  Temp: 98.6 F (37 C)  TempSrc: Oral  SpO2: 100%  Weight: 154 lb 14.4 oz (70.3 kg)   Physical Exam Vitals reviewed. Exam conducted with a chaperone present.  Cardiovascular:     Rate and Rhythm: Normal rate and regular rhythm.  Pulmonary:     Effort: Pulmonary effort is normal.     Breath sounds: Normal breath sounds.  Abdominal:     General: Bowel sounds are normal.     Palpations: Abdomen is soft.     Tenderness: There is no abdominal tenderness.  Genitourinary:    General: Normal vulva.     Pubic Area: No rash.      Labia:        Right: No rash, tenderness, lesion or injury.        Left: No rash, tenderness, lesion or injury.     Assessment & Plan:   See Encounters Tab for problem based charting.  Patient seen with Dr. Mikey Bussing

## 2020-03-03 NOTE — Assessment & Plan Note (Addendum)
Patient has prior history of gonorrhea and chlamydia, candida vulvovaginitis, and recurrent bacterial vaginosis. She is not currently sexually active and has not had intercourse with her prior partner. She endorses vaginal itching over the last several days with redness and swelling of the labia but denies vaginal discharge or pain. On physical examination, there is no redness or swelling of the labia and there is no tenderness to palpation of the external genitalia. There is no obvious discharge. Given patient's history, she would benefit from STD testing in clinic today. -Cervicovaginal swab -Urine cytology for gonorrhea/chlamydia -If patient is to have persistent BV, she would benefit from 7d oral metronidazole   ADDENDUM: Patient's cervicovaginal swab positive for candida vulvovaginitis. Plan to treat with fluconazole 150mg  for two doses spaced three days apart due to recurrence of her infection from October.

## 2020-03-03 NOTE — Assessment & Plan Note (Signed)
Patient reports discontinuing all analgesics since her last office visit in September. She states that her headaches have subsequently increased in frequency and intensity. She describes her headaches as isolated to the left frontotemporal region, nonradiating, pulsating and ranging intensity from 20/10 to 2/10 in severity. She states that the headaches occur daily and can last all day. She denies associated photophobia, phonophobia or aura. Patient's presentation likely consistent with migraine headache without aura. She would benefit from a trial of propranolol for maintenance and sumatriptan for abortive therapy. -Propranolol 60mg  ER daily -Sumatriptan 50mg   -Instructed patient that she may take one dose and if after two hours she continues to have severe headache then she can take a second dose -Follow-up with PCP

## 2020-03-03 NOTE — Patient Instructions (Addendum)
Ms. Merta,  It was a pleasure seeing you in clinic today.  For your headaches: We would like to start propranolol 60mg  extended release which you will need to take every morning. In addition to the propranolol, we will prescribe sumatriptan 50mg  which you can take if you are to develop a bad migraine. If the one dose does not resolve your migraine within two hours then you can take one additional dose.  For your STD testing: We have collected an intravaginal swab to check for trichomonas, BV and candida as well as a urine sample to check for gonorrhea, chlamydia or pregnancy.  Sincerely, Dr. , MD

## 2020-03-04 LAB — URINE CYTOLOGY ANCILLARY ONLY
Chlamydia: NEGATIVE
Comment: NEGATIVE
Comment: NORMAL
Neisseria Gonorrhea: NEGATIVE

## 2020-03-04 LAB — CERVICOVAGINAL ANCILLARY ONLY
Bacterial Vaginitis (gardnerella): NEGATIVE
Candida Glabrata: NEGATIVE
Candida Vaginitis: POSITIVE — AB
Chlamydia: NEGATIVE
Comment: NEGATIVE
Comment: NEGATIVE
Comment: NEGATIVE
Comment: NEGATIVE
Comment: NEGATIVE
Comment: NORMAL
Neisseria Gonorrhea: NEGATIVE
Trichomonas: NEGATIVE

## 2020-03-05 ENCOUNTER — Other Ambulatory Visit: Payer: Self-pay | Admitting: Student

## 2020-03-05 MED ORDER — FLUCONAZOLE 150 MG PO TABS: 150.0000 mg | ORAL_TABLET | ORAL | 0 refills | Status: DC

## 2020-03-05 NOTE — Addendum Note (Signed)
Addended by: Jasmine December on: 03/05/2020 02:14 PM   Modules accepted: Orders

## 2020-03-08 MED FILL — FLUCONAZOLE 150 MG TABS: 150 | 3 days supply | Qty: 2 | Fill #0

## 2020-03-15 NOTE — Progress Notes (Signed)
Internal Medicine Clinic Attending  I saw and evaluated the patient.  I personally confirmed the key portions of the history and exam documented by Dr. Johnson and I reviewed pertinent patient test results.  The assessment, diagnosis, and plan were formulated together and I agree with the documentation in the resident's note.  

## 2020-04-01 ENCOUNTER — Ambulatory Visit: Payer: Self-pay | Admitting: Student

## 2020-04-01 ENCOUNTER — Encounter: Payer: Self-pay | Admitting: Student

## 2020-04-01 ENCOUNTER — Other Ambulatory Visit (HOSPITAL_COMMUNITY)
Admission: RE | Admit: 2020-04-01 | Discharge: 2020-04-01 | Disposition: A | Discharge status: Home / Self Care | Payer: Medicaid Other | Source: Ambulatory Visit | Attending: Student in an Organized Health Care Education/Training Program | Admitting: Student in an Organized Health Care Education/Training Program

## 2020-04-01 VITALS — BP 130/93 | HR 74 | Wt 152.2 lb

## 2020-04-01 DIAGNOSIS — Z113 Encounter for screening for infections with a predominantly sexual mode of transmission: Secondary | ICD-10-CM | POA: Diagnosis not present

## 2020-04-01 DIAGNOSIS — B9689 Other specified bacterial agents as the cause of diseases classified elsewhere: Secondary | ICD-10-CM | POA: Diagnosis not present

## 2020-04-01 DIAGNOSIS — N939 Abnormal uterine and vaginal bleeding, unspecified: Secondary | ICD-10-CM

## 2020-04-01 DIAGNOSIS — N760 Acute vaginitis: Secondary | ICD-10-CM | POA: Insufficient documentation

## 2020-04-01 DIAGNOSIS — Z114 Encounter for screening for human immunodeficiency virus [HIV]: Secondary | ICD-10-CM

## 2020-04-01 DIAGNOSIS — B373 Candidiasis of vulva and vagina: Secondary | ICD-10-CM | POA: Insufficient documentation

## 2020-04-01 LAB — POCT URINE PREGNANCY: Preg Test, Ur: NEGATIVE

## 2020-04-01 NOTE — Progress Notes (Signed)
° °  CC: Abnormal Uterine Bleeding  HPI:  Bonnie Cooper is a 25 y.o. otherwise healthy female, presenting due to concerns for abnormal uterine bleeding. Please see problem-based assessment and plan for full details.   Past Medical History:  Diagnosis Date   Anxiety    Chlamydia    Depression    just recently, no meds. or tx   Headache    Sickle cell trait (HCC)    Tooth pain 09/25/2018   Review of Systems:  All others negative except as noted in assessment / plan.   Physical Exam:  Vitals:   04/01/20 1021  BP: (!) 130/93  Pulse: 74  SpO2: 100%  Weight: 152 lb 3.2 oz (69 kg)   General: Patient appears well. No acute distress. Eyes: Sclera non-icteric. No conjunctival injection.  HENT: Neck is supple. No nasal discharge. Respiratory: Lungs are CTA, bilaterally. No wheezes, rales, or rhonchi.  Cardiovascular: Regular rate and rhythm. No murmurs, rubs, or gallops.  Abdominal: Soft and non-tender to palpation. No rebound or guarding. GU: Pelvic exam performed with chaperone. External exam revealed no paravaginal lesions, erythema, prolapse or rashes. Speculum exam showed small amounts of dark blood eminating from the cervical os without vaginal or cervical lesion, mass, or other discharge. No CVA tenderness.  Musculoskeletal: There is mild right-sided lower back tenderness to palpation. No bony TTP.  Skin: No lesions. No rashes.  Psych: Normal affect. Normal tone of voice.   Assessment & Plan:   See Encounters Tab for problem based charting.  Patient discussed with Dr. Oswaldo Done.  Glenford Bayley, MD 04/02/2020, 4:47 AM Pager: [phone]

## 2020-04-01 NOTE — Patient Instructions (Addendum)
Bonnie Cooper,   Today, we discussed that occasionally women can have spotting or brown vaginal discharge that can be a normal finding.   Your urine pregnancy test today was negative.   We will check for gonorrhea, chlamydia, BV, candida, and trichomonas. I will call you with results and provide treatment if indicated.   Please use barrier contraception in the meantime if you do not wish to become pregnant and we are happy to discuss other measures of birth control if you so wish at any point.  Please call for return appointment if your bleeding continues, becomes very heavy, or if you have any other concerns.  Thank you and take care,  Dr. Laddie Aquas    Dysfunctional Uterine Bleeding Dysfunctional uterine bleeding is abnormal bleeding from the uterus. Dysfunctional uterine bleeding includes:  A menstrual period that comes earlier or later than usual.  A menstrual period that is lighter or heavier than usual, or has large blood clots.  Vaginal bleeding between menstrual periods.  Skipping one or more menstrual periods.  Vaginal bleeding after sex.  Vaginal bleeding after menopause. Follow these instructions at home: Eating and drinking  Eat well-balanced meals. Include foods that are high in iron, such as liver, meat, shellfish, green leafy vegetables, and eggs.  To prevent or treat constipation, your health care provider may recommend that you: ? Drink enough fluid to keep your urine pale yellow. ? Take over-the-counter or prescription medicines. ? Eat foods that are high in fiber, such as beans, whole grains, and fresh fruits and vegetables. ? Limit foods that are high in fat and processed sugars, such as fried or sweet foods.   Medicines  Take over-the-counter and prescription medicines only as told by your health care provider.  Do not change medicines without talking with your health care provider.  Aspirin or medicines that contain aspirin may make the bleeding worse. Do  not take those medicines: ? During the week before your menstrual period. ? During your menstrual period.  If you were prescribed iron pills, take them as told by your health care provider. Iron pills help to replace iron that your body loses because of this condition. Activity  If you need to change your sanitary pad or tampon more than one time every 2 hours: ? Lie in bed with your feet raised (elevated). ? Place a cold pack on your lower abdomen. ? Rest as much as possible until the bleeding stops or slows down.  Do not try to lose weight until the bleeding has stopped and your blood iron level is back to normal. General instructions  For two months, write down: ? When your menstrual period starts. ? When your menstrual period ends. ? When any abnormal vaginal bleeding occurs. ? What problems you notice.  Keep all follow up visits as told by your health care provider. This is important.   Contact a health care provider if you:  Feel light-headed or weak.  Have nausea and vomiting.  Cannot eat or drink without vomiting.  Feel dizzy or have diarrhea while you are taking medicines.  Are taking birth control pills or hormones, and you want to change them or stop taking them. Get help right away if:  You develop a fever or chills.  You need to change your sanitary pad or tampon more than one time per hour.  Your vaginal bleeding becomes heavier, or your flow contains clots more often.  You develop pain in your abdomen.  You lose consciousness.  You  develop a rash. Summary  Dysfunctional uterine bleeding is abnormal bleeding from the uterus.  It includes menstrual bleeding of abnormal duration, volume, or regularity.  Bleeding after sex and after menopause are also considered dysfunctional uterine bleeding. This information is not intended to replace advice given to you by your health care provider. Make sure you discuss any questions you have with your health care  provider. Document Revised: 08/08/2017 Document Reviewed: 08/08/2017 Elsevier Patient Education  2021 [employer].

## 2020-04-02 ENCOUNTER — Telehealth: Payer: Self-pay | Admitting: Student

## 2020-04-02 LAB — CERVICOVAGINAL ANCILLARY ONLY
Bacterial Vaginitis (gardnerella): POSITIVE — AB
Candida Glabrata: NEGATIVE
Candida Vaginitis: POSITIVE — AB
Chlamydia: NEGATIVE
Comment: NEGATIVE
Comment: NEGATIVE
Comment: NEGATIVE
Comment: NEGATIVE
Comment: NEGATIVE
Comment: NORMAL
Neisseria Gonorrhea: NEGATIVE
Trichomonas: NEGATIVE

## 2020-04-02 NOTE — Assessment & Plan Note (Signed)
Bonnie Cooper states that her menses are usually regular, normal in duration and heaviness. She started her last menstrual cycle 03/05/20 and was due to start her period 04/01/20; however, she began to notice brown vaginal discharge (unusual for her) 03/24/20 and has continued to have lighter than average bleeding this cycle. She was last sexually active without barrier contraception or other birth control at the end of last month. She has only 1 female partner that she has been with for some time. She has had history of gonorrhea and chlamydia 11/2019 with recurrent BV and candidal infections, although denies any other abnormal vaginal discharge, odor, pain, itching, trauma, lower abdominal pain. Only other symptom is mild central low back pain, worse than her usual cramping. No known history of fibroids. Last pap was normal in 2019.   - Pelvic exam unremarkable, with small amount of blood eminating from the cervical os  - Urine pregnancy test negative - Cervicovaginal swab testing for gonorrhea, chlamydia at patient's request as well as BV, candida, trichomonas  - Patient is interested in HIV PrEP. Will call to schedule lab only appointment for HIV screening and renal function panel and provide Descovy if unremarkable  - Encouraged use of barrier contraception to avoid STD and undesired pregnancy

## 2020-04-02 NOTE — Telephone Encounter (Signed)
Informed Bonnie Cooper that she will need to return to [facility] for lab only appointment for HIV antibody testing and renal function testing prior to starting Descovy for HIV PrEP. Will notify front desk to schedule an appointment and call her back with cervicovaginal swab results when available.   Glenford Bayley, MD 04/02/2020, 12:08 PM Pager: [phone]

## 2020-04-02 NOTE — Progress Notes (Signed)
Internal Medicine Clinic Attending  Case discussed with Dr. Speakman  At the time of the visit.  We reviewed the resident's history and exam and pertinent patient test results.  I agree with the assessment, diagnosis, and plan of care documented in the resident's note.  

## 2020-04-06 ENCOUNTER — Other Ambulatory Visit (HOSPITAL_COMMUNITY): Payer: Self-pay | Admitting: Student

## 2020-04-06 ENCOUNTER — Telehealth: Payer: Self-pay | Admitting: Student

## 2020-04-06 ENCOUNTER — Other Ambulatory Visit (INDEPENDENT_AMBULATORY_CARE_PROVIDER_SITE_OTHER): Payer: Self-pay

## 2020-04-06 DIAGNOSIS — Z114 Encounter for screening for human immunodeficiency virus [HIV]: Secondary | ICD-10-CM

## 2020-04-06 LAB — RENAL FUNCTION PANEL
Albumin: 3.8 g/dL (ref 3.5–5.0)
Anion gap: 9 (ref 5–15)
BUN: 13 mg/dL (ref 6–20)
CO2: 27 mmol/L (ref 22–32)
Calcium: 8.9 mg/dL (ref 8.9–10.3)
Chloride: 105 mmol/L (ref 98–111)
Creatinine, Ser: 0.95 mg/dL (ref 0.44–1.00)
GFR, Estimated: >60 mL/min (ref >60)
Glucose, Bld: 74 mg/dL (ref 70–99)
Phosphorus: 3.7 mg/dL (ref 2.5–4.6)
Potassium: 3.9 mmol/L (ref 3.5–5.1)
Sodium: 141 mmol/L (ref 135–145)

## 2020-04-06 LAB — HIV ANTIBODY (ROUTINE TESTING W REFLEX): HIV Screen 4th Generation wRfx: NONREACTIVE

## 2020-04-06 MED ORDER — FLUCONAZOLE 150 MG PO TABS: 150.0000 mg | ORAL_TABLET | ORAL | 0 refills | Status: DC

## 2020-04-06 MED ORDER — METRONIDAZOLE 500 MG PO TABS: 500.0000 mg | ORAL_TABLET | Freq: Two times a day (BID) | ORAL | 0 refills | Status: DC

## 2020-04-06 MED ORDER — METRONIDAZOLE 0.75 % EX GEL: 1.0000 "application " | CUTANEOUS | 1 refills | Status: AC

## 2020-04-06 MED ORDER — METRONIDAZOLE 1 % EX GEL: CUTANEOUS | 1 refills | Status: DC

## 2020-04-06 MED ORDER — FLUCONAZOLE 100 MG PO TABS: 100.0000 mg | ORAL_TABLET | ORAL | 0 refills | Status: DC

## 2020-04-06 MED FILL — FLUCONAZOLE 150 MG TABS: 150 | 3 days supply | Qty: 3 | Fill #0

## 2020-04-06 MED FILL — FLUCONAZOLE 100 MG TABLET: 100 | 28 days supply | Qty: 4 | Fill #0

## 2020-04-06 MED FILL — metroNIDAZOLE 500 MG TABS: 500 | 7 days supply | Qty: 14 | Fill #0

## 2020-04-06 MED FILL — metroNIDAZOLE 0.75 % GEL: 0.75 | 30 days supply | Qty: 70 | Fill #0

## 2020-04-06 NOTE — Addendum Note (Signed)
Addended by: Glenford Bayley on: 04/06/2020 12:12 PM   Modules accepted: Orders

## 2020-04-06 NOTE — Telephone Encounter (Signed)
Spoke with Bonnie Cooper, informing her that her cervicovaginal swab returned positive for BV and candida. Given that she has had 3+ of these infections over the last 12 months, she would benefit from prolonged treatment with both fluconazole and metronidazole. Patient is in agreement. Will send prescriptions to the pharmacy.   Patient also has lab only appointment today for HIV screening and RFP. Will follow up and start on Descovy for PrEP if unremarkable.   Glenford Bayley, MD 04/06/2020, 11:00 AM Pager: [phone]

## 2020-04-07 ENCOUNTER — Other Ambulatory Visit: Payer: Self-pay | Admitting: Student

## 2020-04-07 ENCOUNTER — Telehealth: Payer: Self-pay | Admitting: Student

## 2020-04-07 DIAGNOSIS — Z79899 Other long term (current) drug therapy: Secondary | ICD-10-CM

## 2020-04-07 MED ORDER — EMTRICITABINE-TENOFOVIR AF 200-25 MG PO TABS: 1.0000 | ORAL_TABLET | Freq: Every day | ORAL | 0 refills | Status: DC

## 2020-04-07 NOTE — Telephone Encounter (Signed)
Called patient to inform her her HIV was negative and renal function was normal.   - Prescribed Descovy once daily  - Will require 90 day prescriptions with repeat HIV and renal testing q 3 months while on therapy - Will notify front desk to schedule follow up visit for patient in 3 months w/ PCP  Glenford Bayley, MD 04/07/2020, 7:12 PM Pager: [phone]

## 2020-05-03 MED FILL — metroNIDAZOLE 0.75 % GEL: 0.75 | 30 days supply | Qty: 70 | Fill #1

## 2020-05-03 MED FILL — FLUCONAZOLE 100 MG TABLET: 100 | 28 days supply | Qty: 4 | Fill #1

## 2020-05-25 ENCOUNTER — Ambulatory Visit: Payer: Medicaid Other

## 2020-06-15 ENCOUNTER — Ambulatory Visit (INDEPENDENT_AMBULATORY_CARE_PROVIDER_SITE_OTHER): Payer: Self-pay | Admitting: Student

## 2020-06-15 ENCOUNTER — Encounter: Payer: Self-pay | Admitting: Student

## 2020-06-15 ENCOUNTER — Other Ambulatory Visit: Payer: Self-pay

## 2020-06-15 VITALS — BP 120/81 | HR 67 | Temp 98.2°F | Ht 63.0 in | Wt 162.2 lb

## 2020-06-15 DIAGNOSIS — N939 Abnormal uterine and vaginal bleeding, unspecified: Secondary | ICD-10-CM

## 2020-06-15 DIAGNOSIS — Z711 Person with feared health complaint in whom no diagnosis is made: Secondary | ICD-10-CM

## 2020-06-15 DIAGNOSIS — Z79899 Other long term (current) drug therapy: Secondary | ICD-10-CM

## 2020-06-15 DIAGNOSIS — Z113 Encounter for screening for infections with a predominantly sexual mode of transmission: Secondary | ICD-10-CM

## 2020-06-15 NOTE — Patient Instructions (Signed)
Thank you, Ms.Marcy Salvo for allowing [place] to provide your care today. Today we discussed your Descovy medication and why we need to check labs. Continue to monitor for any new symptoms and always use protection to void unwanted pregnancy or an STD   I have ordered the following labs for you:   Lab Orders     BMP8+Anion Gap     Pregnancy Test, Urine, Qual     HIV antibody (with reflex)   I will call if any are abnormal. All of your labs can be accessed through "My Chart".  Please follow-up in 3 months  Should you have any questions or concerns please call the internal medicine clinic at [phone].    Sharrell Ku, MD, MPH [facility]   My Chart Access: [email or web address]   If you have not already done so, please get your COVID 19 vaccine  To schedule an appointment for a COVID vaccine choice any of the following: Go to [email or web address]   Go to [email or web address]                  Call [phone]                                     Call [phone] and select Option 2

## 2020-06-15 NOTE — Assessment & Plan Note (Signed)
Patient states abnormal bleeding havs improved after treatment for BV. She recently had an abortion with no complications. She is not sexually active but also not on any contraception at the moment. --Encouraged the use of contraception and barrier protection if patient becomes sexually active. --Continue preexposure prophylaxis for HIV

## 2020-06-15 NOTE — Assessment & Plan Note (Addendum)
Patient expressed interest in HIV preexposure prophylaxis during last office visit. She was started on Descovy daily and currently here for 69-month lab work to monitor renal function and check for HIV. Patient states she is currently not sexually active. She found out on her birthday 6 weeks ago that she was pregnant and recently had an abortion 3 days ago. She has abstained from sex since her abortion and plans to continue abstinence for the time being.  Patient states she is coping with the abortion fine and does not want to discuss it. She denies any vaginal bleeding or discharge.   Plan: --Checking BMP and HIV, plan to follow-up these labs. --Discontinued urine pregnancy test after patient revealed she had abortion a few days ago.  --Follow-up in 3 months

## 2020-06-15 NOTE — Progress Notes (Signed)
   CC: Lab work  HPI:  [name] is a 25 y.o. female with PMH as below who presents to clinic for lab work after starting the Descovy for preexposure prophylaxis. Please see problem based charting for evaluation, assessment and plan.  Past Medical History:  Diagnosis Date  . Anxiety   . Chlamydia   . Depression    just recently, no meds. or tx  . Headache   . Sickle cell trait (HCC)   . Tooth pain 09/25/2018    Review of Systems:  Constitutional: Negative for fatigue Eyes: Negative for visual changes Abdomen: Negative for abdominal pain, constipation or diarrhea GU: Negative for vaginal bleeding or discharge Neuro: Negative for headache or weakness  Physical Exam: General: Pleasant, well-appearing young lady. No acute distress. Cardiac: RRR. No murmurs, rubs or gallops.  No LE edema Respiratory: Lungs CTAB. No wheezing or crackles. Abdominal: Soft, symmetric and non tender. Normal BS. Skin: Warm, dry and intact without rashes or lesions Neuro: A&O x 3. Moves all extremities Psych: Appropriate mood and affect.  Vitals:   06/15/20 0920  BP: 120/81  Pulse: 67  Temp: 98.2 F (36.8 C)  TempSrc: Oral  SpO2: 100%  Weight: 162 lb 3.2 oz (73.6 kg)  Height: 5\' 3"  (1.6 m)    Assessment & Plan:   See Encounters Tab for problem based charting.  Patient discussed with Dr. , MD, MPH

## 2020-06-16 LAB — BMP8+ANION GAP
Anion Gap: 12 mmol/L (ref 10.0–18.0)
BUN/Creatinine Ratio: 9 (ref 9–23)
BUN: 7 mg/dL (ref 6–20)
CO2: 23 mmol/L (ref 20–29)
Calcium: 9 mg/dL (ref 8.7–10.2)
Chloride: 103 mmol/L (ref 96–106)
Creatinine, Ser: 0.78 mg/dL (ref 0.57–1.00)
Glucose: 75 mg/dL (ref 65–99)
Potassium: 4 mmol/L (ref 3.5–5.2)
Sodium: 138 mmol/L (ref 134–144)
eGFR: 107 mL/min/{1.73_m2} (ref >59)

## 2020-06-16 LAB — HIV ANTIBODY (ROUTINE TESTING W REFLEX): HIV Screen 4th Generation wRfx: NONREACTIVE

## 2020-06-16 NOTE — Progress Notes (Signed)
Internal Medicine Clinic Attending  Case discussed with Dr. Amponsah at the time of the visit.  We reviewed the resident's history and exam and pertinent patient test results.  I agree with the assessment, diagnosis, and plan of care documented in the resident's note.  Elsye Mccollister, M.D., Ph.D.  

## 2020-07-28 ENCOUNTER — Encounter: Payer: Medicaid Other | Admitting: Internal Medicine

## 2020-07-29 ENCOUNTER — Encounter: Payer: Medicaid Other | Admitting: Internal Medicine

## 2020-08-05 NOTE — Progress Notes (Signed)
   CC: Vaginal irritation  HPI:  Bonnie Cooper is a 25 y.o. with a history listed below including recurrent BV and vaginal candidiasis, chlamydia, gonorrhea, depression, and migraines who is presenting for vaginal irritation.  Past Medical History:  Diagnosis Date  . Anxiety   . Chlamydia   . Depression    just recently, no meds. or tx  . Headache   . Sickle cell trait (HCC)   . Tooth pain 09/25/2018   Review of Systems:   Constitutional: Negative for chills and fever.  Respiratory: Negative for shortness of breath.   Cardiovascular: Negative for chest pain and leg swelling.  Gastrointestinal: Negative for abdominal pain, nausea and vomiting.  Genitourinary: Positive for vaginal irritation. Negative for discharge, dysuria, hematuria, or dyspareunia.  Neurological: Negative for dizziness and headaches.   Physical Exam:  Vitals:   08/06/20 0919 08/06/20 0921  BP: (!) 122/93 117/77  Pulse: 100 98  Temp: 99.3 F (37.4 C)   TempSrc: Oral   SpO2: 100%   Weight: 157 lb 14.4 oz (71.6 kg)   Height: 5\' 3"  (1.6 m)    Physical Exam Exam conducted with a chaperone present.  Constitutional:      Appearance: Normal appearance.  Cardiovascular:     Rate and Rhythm: Normal rate and regular rhythm.  Pulmonary:     Effort: Pulmonary effort is normal.     Breath sounds: Normal breath sounds.  Abdominal:     General: Abdomen is flat. Bowel sounds are normal.     Palpations: Abdomen is soft.  Genitourinary:    Exam position: Lithotomy position.     Pubic Area: No rash.      Labia:        Right: No rash.        Left: No rash.      Vagina: No vaginal discharge or tenderness.     Cervix: Normal.  Neurological:     Mental Status: She is alert.      Assessment & Plan:   See Encounters Tab for problem based charting.  Patient discussed with Dr. 

## 2020-08-06 ENCOUNTER — Ambulatory Visit: Payer: Self-pay | Admitting: Internal Medicine

## 2020-08-06 ENCOUNTER — Encounter: Payer: Self-pay | Admitting: Internal Medicine

## 2020-08-06 ENCOUNTER — Other Ambulatory Visit: Payer: Self-pay

## 2020-08-06 ENCOUNTER — Other Ambulatory Visit (HOSPITAL_COMMUNITY)
Admission: RE | Admit: 2020-08-06 | Discharge: 2020-08-06 | Disposition: A | Discharge status: Home / Self Care | Payer: Medicaid Other | Source: Ambulatory Visit | Attending: Student in an Organized Health Care Education/Training Program | Admitting: Student in an Organized Health Care Education/Training Program

## 2020-08-06 VITALS — BP 117/77 | HR 98 | Temp 99.3°F | Ht 63.0 in | Wt 157.9 lb

## 2020-08-06 DIAGNOSIS — G43019 Migraine without aura, intractable, without status migrainosus: Secondary | ICD-10-CM

## 2020-08-06 DIAGNOSIS — N898 Other specified noninflammatory disorders of vagina: Secondary | ICD-10-CM | POA: Insufficient documentation

## 2020-08-06 NOTE — Assessment & Plan Note (Signed)
Patient is no longer taking the propranolol daily, she has sumatriptan as needed.  She denies any recent headaches that she has required sumatriptan for. -Removed propranolol from medication list -Continue sumatriptan as needed

## 2020-08-06 NOTE — Patient Instructions (Signed)
Ms. Bonnie Cooper,  It was a pleasure to see you today. Thank you for coming in.   Today we discussed your vaginal irritation. We are checking you for an infection. Please continue to use your current medications, including the fluconazole and the metronidazole gel. If you do have an infection I will refer you to the gynecologist,  Please return to clinic in 2 months or sooner if needed.   Thank you again for coming in.   Claudean Severance.D.

## 2020-08-06 NOTE — Assessment & Plan Note (Signed)
Patient reports that for the past 1 and half weeks that she has been having some more vaginal irritation and itchiness along inner area.  She denies any discharge, dysuria, hematuria, dyspareunia, rash, lesions, or other symptoms.  She denies any systemic symptoms such as fevers, chills, nausea, vomiting, abdominal pain, chest pain, shortness of breath, or other symptoms.  She is currently on a 70-month course of fluconazole every 3 days and daily metronidazole cream.  She is currently sexually active with 1 partner and has consistent condom use.  She does not do any vaginal irrigation, she only uses soap in the area, has not no recent changes to her laundry detergent, underwear, or soap.  Patient has a history of GC and chlamydia.  On exam there is no significant discharge noted in the vaginal canal, no rash or any lesions noted. We will obtain wet prep to evaluate for GC/chlamydia, BV, Candida, and trichomonas.  If she continues to have BV or Candida will refer to OB/GYN to evaluate for causes of recurrent infection.  Advised patient to try changing anything that comes into contact with the area to see if this may be causing irritation. -Wet prep -Continue metronidazole gel -Continue fluconazole every 3 days

## 2020-08-10 LAB — CERVICOVAGINAL ANCILLARY ONLY
Bacterial Vaginitis (gardnerella): NEGATIVE
Candida Glabrata: NEGATIVE
Candida Vaginitis: POSITIVE — AB
Chlamydia: NEGATIVE
Comment: NEGATIVE
Comment: NEGATIVE
Comment: NEGATIVE
Comment: NEGATIVE
Comment: NEGATIVE
Comment: NORMAL
Neisseria Gonorrhea: NEGATIVE
Trichomonas: NEGATIVE

## 2020-08-10 NOTE — Progress Notes (Signed)
Internal Medicine Clinic Attending ° °Case discussed with Dr. Krienke  At the time of the visit.  We reviewed the resident’s history and exam and pertinent patient test results.  I agree with the assessment, diagnosis, and plan of care documented in the resident’s note.  °

## 2020-08-11 ENCOUNTER — Encounter: Payer: Self-pay | Admitting: Internal Medicine

## 2020-09-14 ENCOUNTER — Encounter: Payer: Self-pay | Admitting: *Deleted

## 2020-11-04 ENCOUNTER — Ambulatory Visit: Payer: Managed Care, Other (non HMO) | Admitting: Internal Medicine

## 2020-11-04 ENCOUNTER — Other Ambulatory Visit: Payer: Self-pay

## 2020-11-04 ENCOUNTER — Other Ambulatory Visit (HOSPITAL_COMMUNITY)
Admission: RE | Admit: 2020-11-04 | Discharge: 2020-11-04 | Disposition: A | Discharge status: Home / Self Care | Payer: Managed Care, Other (non HMO) | Source: Ambulatory Visit | Attending: Internal Medicine | Admitting: Internal Medicine

## 2020-11-04 ENCOUNTER — Encounter: Payer: Self-pay | Admitting: Internal Medicine

## 2020-11-04 VITALS — BP 112/83 | HR 72 | Temp 98.2°F | Ht 63.0 in | Wt 166.4 lb

## 2020-11-04 DIAGNOSIS — N939 Abnormal uterine and vaginal bleeding, unspecified: Secondary | ICD-10-CM | POA: Insufficient documentation

## 2020-11-04 DIAGNOSIS — Z124 Encounter for screening for malignant neoplasm of cervix: Secondary | ICD-10-CM | POA: Diagnosis not present

## 2020-11-04 DIAGNOSIS — R350 Frequency of micturition: Secondary | ICD-10-CM

## 2020-11-04 DIAGNOSIS — Z113 Encounter for screening for infections with a predominantly sexual mode of transmission: Secondary | ICD-10-CM

## 2020-11-04 DIAGNOSIS — Z711 Person with feared health complaint in whom no diagnosis is made: Secondary | ICD-10-CM

## 2020-11-04 DIAGNOSIS — N760 Acute vaginitis: Secondary | ICD-10-CM | POA: Diagnosis not present

## 2020-11-04 DIAGNOSIS — B9689 Other specified bacterial agents as the cause of diseases classified elsewhere: Secondary | ICD-10-CM | POA: Diagnosis not present

## 2020-11-04 NOTE — Assessment & Plan Note (Addendum)
Patient presents today for evaluation of abnormal bleeding.  States that she had her last menstrual period from July 19 to July 25.  However she noticed bleeding continued the next day for the past 3 weeks.  States the bleeding stopped about 4 days ago.  Initially she noted passing some small blood clots.  States the bleeding was light in nature.  Denies any vaginal pain or cramping, abnormal discharge, dyspareunia, dysuria, urinary frequency.  States that she had a similar episode of abnormal bleeding in January and later found out to be pregnant.  She did take a urine pregnancy test the week after her period which was negative.  States that she is currently sexually active with 1 partner and does not use any barrier contraception.  She is not interested in any form of birth control at this time.  She is not actively trying to get pregnant however has discussed with her partner that she is okay if it happens.  Pelvic exam unremarkable except for some white discharge noticed in the vaginal canal which patient states is a honey pot vaginal suppository she used last night.  Plan: -Follow-up Pap smear cytology and cervicovaginal swab -Follow-up urine pregnancy test

## 2020-11-04 NOTE — Assessment & Plan Note (Signed)
Patient states that she never started the Descovy therapy.  She just decided against this.  Removed HIV preexposure prophylaxis from patient's medication list.

## 2020-11-04 NOTE — Progress Notes (Signed)
   CC: Abnormal bleeding  HPI:  [name] is a 25 y.o. with a past medical history listed below presenting for evaluation of abnormal bleeding. For details of today's visit and the status of his chronic medical issues please refer to the assessment and plan.   Past Medical History:  Diagnosis Date   Anxiety    Chlamydia    Depression    just recently, no meds. or tx   Headache    Sickle cell trait (HCC)    Tooth pain 09/25/2018   Review of Systems: Negative except as per assessment and plan  Physical Exam:  Vitals:   11/04/20 0947  BP: 112/83  Pulse: 72  Temp: 98.2 F (36.8 C)  TempSrc: Oral  SpO2: 100%  Weight: 166 lb 6.4 oz (75.5 kg)  Height: 5\' 3"  (1.6 m)   Physical Exam General: alert, appears stated age, in no acute distress HEENT: Normocephalic, atraumatic, EOM intact, conjunctiva normal CV: Regular rate and rhythm, no murmurs rubs or gallops Pulm: Clear to auscultation bilaterally, normal work of breathing Abdomen: Soft, nondistended, bowel sounds present, no tenderness to palpation MSK: No lower extremity edema Skin: Warm and dry Neuro: Alert and oriented x3  GU: External and internal vulva unremarkable, vaginal canal without erythema, significant white discharge noted however patient states this is a suppository she used  Assessment & Plan:   See Encounters Tab for problem based charting.  Patient discussed with Dr. 

## 2020-11-04 NOTE — Patient Instructions (Signed)
I will call you with the results of your Pap smear, cervical vaginal swab and urine pregnancy test.

## 2020-11-05 LAB — CERVICOVAGINAL ANCILLARY ONLY
Bacterial Vaginitis (gardnerella): POSITIVE — AB
Candida Glabrata: NEGATIVE
Candida Vaginitis: NEGATIVE
Chlamydia: NEGATIVE
Comment: NEGATIVE
Comment: NEGATIVE
Comment: NEGATIVE
Comment: NEGATIVE
Comment: NEGATIVE
Comment: NORMAL
Neisseria Gonorrhea: NEGATIVE
Trichomonas: NEGATIVE

## 2020-11-05 LAB — PREGNANCY, URINE: Preg Test, Ur: NEGATIVE

## 2020-11-05 LAB — CYTOLOGY - PAP
Adequacy: ABSENT
Comment: NEGATIVE
Diagnosis: NEGATIVE
High risk HPV: NEGATIVE

## 2020-11-09 NOTE — Progress Notes (Signed)
Internal Medicine Clinic Attending ° °Case discussed with Dr. Rehman  At the time of the visit.  We reviewed the resident’s history and exam and pertinent patient test results.  I agree with the assessment, diagnosis, and plan of care documented in the resident’s note.  ° °

## 2020-12-13 ENCOUNTER — Other Ambulatory Visit: Payer: Self-pay

## 2020-12-13 ENCOUNTER — Encounter: Payer: Self-pay | Admitting: Student

## 2020-12-13 ENCOUNTER — Ambulatory Visit: Payer: Managed Care, Other (non HMO) | Admitting: Student

## 2020-12-13 DIAGNOSIS — Z3201 Encounter for pregnancy test, result positive: Secondary | ICD-10-CM | POA: Diagnosis not present

## 2020-12-13 DIAGNOSIS — Z3490 Encounter for supervision of normal pregnancy, unspecified, unspecified trimester: Secondary | ICD-10-CM

## 2020-12-13 DIAGNOSIS — N939 Abnormal uterine and vaginal bleeding, unspecified: Secondary | ICD-10-CM

## 2020-12-13 LAB — POCT URINE PREGNANCY: Preg Test, Ur: POSITIVE — AB

## 2020-12-13 NOTE — Assessment & Plan Note (Signed)
Young female, G2P1001, presents for evaluation after a positive urine pregnancy test at home. Patient endorse some breast tenderness and mild nausea however no vomiting, abdominal pain, dizziness, dysuria, vaginal bleeding or discharge. Last menstrual period was 6 weeks ago. Urine pregnancy test today confirms patient's pregnancy.  Plan: -- Referral to OB/GYN for pregnancy monitoring and management. -- Follow-up as needed

## 2020-12-13 NOTE — Progress Notes (Signed)
   CC: Positive pregnancy test  HPI:  [name] is a 25 y.o. with PMH of abnormal uterine bleeding presents for evaluation of possible pregnancy. Please see problem based charting for evaluation, assessment and plan.  Past Medical History:  Diagnosis Date   Anxiety    Chlamydia    Depression    just recently, no meds. or tx   Headache    Sickle cell trait (HCC)    Tooth pain 09/25/2018    Review of Systems:  Constitutional: Negative for fever or fatigue Breast: Positive for breast tenderness Eyes: Negative for visual changes Respiratory: Negative for shortness of breath Cardiac: Negative for chest pain Genitourinary: Negative for dysuria, vaginal bleeding or discharge Abdomen: Positive for nausea. Negative for abdominal pain and vomiting Neuro: Negative for headache or weakness  Physical Exam: General: Pleasant, well developed young female.  No acute distress. Cardiac: RRR. No murmurs, rubs or gallops. No LE edema Respiratory: Lungs CTAB. No wheezing or crackles. Abdominal: Soft, symmetric and non tender. Normal BS. Skin: Warm, dry and intact without rashes or lesions Extremities: Atraumatic. Full ROM. Palpable radial and DP pulses. Neuro: A&O x 3. Moves all extremities Psych: Appropriate mood and affect.  Vitals:   12/13/20 0943  BP: 121/83  Pulse: 69  Temp: 98.4 F (36.9 C)  TempSrc: Oral  SpO2: 100%  Weight: 171 lb 11.2 oz (77.9 kg)  Height: 5\' 3"  (1.6 m)     Assessment & Plan:   See Encounters Tab for problem based charting.  Patient discussed with Dr. , MD, MPH

## 2020-12-13 NOTE — Patient Instructions (Signed)
Thank you, [name] for allowing [place] to provide your care today. Today, we discussed your pregnancy test.  We are getting a confirmatory urine test and referring you to OB/GYN so they can continue to follow you throughout your pregnancy.  I have ordered the following labs for you:   Lab Orders         POCT Urine Pregnancy      I will call if any are abnormal. All of your labs can be accessed through "My Chart".  I have place a referrals to OB/GYN  My Chart Access: [email or web address]  Please follow-up as needed  Please make sure to arrive 15 minutes prior to your next appointment. If you arrive late, you may be asked to reschedule.    We look forward to seeing you next time. Please call our clinic at [phone] if you have any questions or concerns. The best time to call is Monday-Friday from 9am-4pm, but there is someone available 24/7. If after hours or the weekend, call the main hospital number and ask for the Internal Medicine Resident On-Call. If you need medication refills, please notify your pharmacy one week in advance and they will send [place] a request.   Thank you for letting us take part in your care. Wishing you the best!  Steffanie Rainwater, MD 12/13/2020, 10:37 AM IM Resident, PGY-2 [facility] 41:10

## 2020-12-14 NOTE — Progress Notes (Signed)
Internal Medicine Clinic Attending  Case discussed with Dr. Amponsah  At the time of the visit.  We reviewed the resident's history and exam and pertinent patient test results.  I agree with the assessment, diagnosis, and plan of care documented in the resident's note.  

## 2020-12-30 ENCOUNTER — Encounter (HOSPITAL_COMMUNITY): Payer: Self-pay | Admitting: Obstetrics and Gynecology

## 2020-12-30 ENCOUNTER — Telehealth (INDEPENDENT_AMBULATORY_CARE_PROVIDER_SITE_OTHER): Payer: Managed Care, Other (non HMO)

## 2020-12-30 ENCOUNTER — Other Ambulatory Visit: Payer: Self-pay

## 2020-12-30 ENCOUNTER — Inpatient Hospital Stay (HOSPITAL_COMMUNITY)
Admission: AD | Admit: 2020-12-30 | Discharge: 2020-12-30 | Disposition: A | Discharge status: Home / Self Care | Payer: Managed Care, Other (non HMO) | Attending: Obstetrics and Gynecology | Admitting: Obstetrics and Gynecology

## 2020-12-30 ENCOUNTER — Other Ambulatory Visit (HOSPITAL_COMMUNITY): Payer: Self-pay

## 2020-12-30 ENCOUNTER — Inpatient Hospital Stay (HOSPITAL_COMMUNITY): Payer: Managed Care, Other (non HMO)

## 2020-12-30 ENCOUNTER — Other Ambulatory Visit (HOSPITAL_COMMUNITY)
Admission: RE | Admit: 2020-12-30 | Discharge: 2020-12-30 | Disposition: A | Discharge status: Home / Self Care | Payer: Managed Care, Other (non HMO) | Source: Ambulatory Visit | Attending: Family Medicine | Admitting: Family Medicine

## 2020-12-30 VITALS — Wt 172.1 lb

## 2020-12-30 DIAGNOSIS — O26891 Other specified pregnancy related conditions, first trimester: Secondary | ICD-10-CM

## 2020-12-30 DIAGNOSIS — R102 Pelvic and perineal pain: Secondary | ICD-10-CM

## 2020-12-30 DIAGNOSIS — Z3A09 9 weeks gestation of pregnancy: Secondary | ICD-10-CM

## 2020-12-30 DIAGNOSIS — O26899 Other specified pregnancy related conditions, unspecified trimester: Secondary | ICD-10-CM

## 2020-12-30 DIAGNOSIS — Z3A00 Weeks of gestation of pregnancy not specified: Secondary | ICD-10-CM

## 2020-12-30 DIAGNOSIS — R109 Unspecified abdominal pain: Secondary | ICD-10-CM

## 2020-12-30 DIAGNOSIS — Z3480 Encounter for supervision of other normal pregnancy, unspecified trimester: Secondary | ICD-10-CM

## 2020-12-30 DIAGNOSIS — Z3A13 13 weeks gestation of pregnancy: Secondary | ICD-10-CM | POA: Diagnosis not present

## 2020-12-30 DIAGNOSIS — R110 Nausea: Secondary | ICD-10-CM

## 2020-12-30 DIAGNOSIS — O0990 Supervision of high risk pregnancy, unspecified, unspecified trimester: Secondary | ICD-10-CM | POA: Insufficient documentation

## 2020-12-30 DIAGNOSIS — Z136 Encounter for screening for cardiovascular disorders: Secondary | ICD-10-CM

## 2020-12-30 DIAGNOSIS — Z3491 Encounter for supervision of normal pregnancy, unspecified, first trimester: Secondary | ICD-10-CM

## 2020-12-30 DIAGNOSIS — O99891 Other specified diseases and conditions complicating pregnancy: Secondary | ICD-10-CM

## 2020-12-30 LAB — URINALYSIS, ROUTINE W REFLEX MICROSCOPIC
Bilirubin Urine: NEGATIVE
Glucose, UA: NEGATIVE mg/dL
Hgb urine dipstick: NEGATIVE
Ketones, ur: NEGATIVE mg/dL
Leukocytes,Ua: NEGATIVE
Nitrite: NEGATIVE
Protein, ur: NEGATIVE mg/dL
Specific Gravity, Urine: 1.013 (ref 1.005–1.030)
pH: 6 (ref 5.0–8.0)

## 2020-12-30 MED ORDER — BLOOD PRESSURE MONITORING DEVI: 1.0000 | 0 refills | Status: DC

## 2020-12-30 MED ORDER — PRENATAL 27-1 MG PO TABS
1.0000 | ORAL_TABLET | Freq: Every day | ORAL | 11 refills | Status: DC
  Filled 2020-12-30: qty 30, 30d supply, fill #0

## 2020-12-30 MED ORDER — UNISOM SLEEPTABS 25 MG PO TABS
25.0000 mg | ORAL_TABLET | Freq: Every evening | ORAL | 0 refills | Status: DC | PRN
Start: 2020-12-30 — End: 2021-01-17
  Filled 2020-12-30: qty 30, 30d supply, fill #0

## 2020-12-30 MED ORDER — ACETAMINOPHEN 500 MG PO TABS
1000.0000 mg | ORAL_TABLET | Freq: Once | ORAL | Status: AC
  Administered 2020-12-30: 1000 mg via ORAL
  Filled 2020-12-30: qty 2

## 2020-12-30 MED ORDER — VITAMIN B-6 25 MG PO TABS
25.0000 mg | ORAL_TABLET | Freq: Every day | ORAL | 1 refills | Status: DC
  Filled 2020-12-30: qty 30, 30d supply, fill #0

## 2020-12-30 NOTE — Progress Notes (Signed)
Pt states that she called our office yesterday to state that she was having a lot of Pelvic pain, she stated that she was told to come in office for her visit. No notes were made on her chart of this conversation. Talked with Edd Arbour, CNM about Pt's issues, she advise that Pt should go to [facility] just to make sure of no ectopic pregnancy and that she so far along. Pt verbalized understanding.

## 2020-12-30 NOTE — Addendum Note (Signed)
Addended by: Henrietta Dine on: 12/30/2020 10:35 AM   Modules accepted: Orders

## 2020-12-30 NOTE — MAU Note (Signed)
Pt visited the [facility] complaining of pelvic pain (lower abdomen) and abdominal cramping (middle abdomen). Pt states periods are usually regular, they can be "a day late or a day early" LMP according to pt is 09/22/20. Pelvic pain started about a week ago according to patient, 5.5/10 on a 0-10 pain level scale and pt states it has been consistently the same pain level. Abdominal cramping began this morning about 0730, rating it a 7/10. Pt denies anything making the pelvic pain or abdominal cramping worse or better and states it resolves on its' own.Pt denies taking medicine for pain.   FHT: 175 doppler

## 2020-12-30 NOTE — Progress Notes (Signed)
New OB Intake  I connected with  Bonnie Cooper on 12/30/20 at  8:15 AM EDT by MyChart Video Visit and verified that I am speaking with the correct person using two identifiers. Nurse is located at [facility] and pt is located at [employer].  I discussed the limitations, risks, security and privacy concerns of performing an evaluation and management service by telephone and the availability of in person appointments. I also discussed with the patient that there may be a patient responsible charge related to this service. The patient expressed understanding and agreed to proceed.  I explained I am completing New OB Intake today. We discussed her EDD of 07/05/21 that is based on LMP of 09/28/20. Pt is G2/P1. I reviewed her allergies, medications, Medical/Surgical/OB history, and appropriate screenings. I informed her of [facility] services. Based on history, this is a/an  pregnancy uncomplicated .   Patient Active Problem List   Diagnosis Date Noted   Supervision of other normal pregnancy, antepartum 12/30/2020   Pregnancy confirmed by positive urine test 12/13/2020   Concern about STD in female without diagnosis 06/15/2020   Gonorrhea 12/01/2019   Chlamydia 12/01/2019   Bacterial vaginosis 12/01/2019   Asymptomatic bacteriuria 12/01/2019   Hypothyroidism 02/18/2019   Abnormal uterine bleeding 01/07/2019   Major depressive disorder 11/19/2018   Vaginal itching 10/29/2018   Migraine headache without aura 08/06/2018    Concerns addressed today  Delivery Plans:  Plans to deliver at [facility].   MyChart/Babyscripts MyChart access verified. I explained pt will have some visits in office and some virtually. Babyscripts instructions given and order placed. Patient verifies receipt of registration text/e-mail. Account successfully created and app downloaded.  Blood Pressure Cuff  Blood pressure cuff ordered for patient to pick-up from [employer]. Explained after first prenatal appt pt will check weekly and  document in Babyscripts.  Weight scale: Patient    have weight scale. Weight scale ordered for patient to pick up form [facility].   Anatomy US Explained first scheduled [place] will be around 19 weeks. Anatomy US scheduled for 02/08/21 at 09:30A. Pt notified to arrive at 09:15A.  Labs Discussed Bonnie Cooper genetic screening with patient. Would like both Panorama and Horizon drawn at new OB visit. Routine prenatal labs needed.  Covid Vaccine Patient has covid vaccine.   Mother/ Baby Dyad Candidate?    If yes, offer as possibility  Informed patient of [facility] website  and placed link in her AVS.   Social Determinants of Health Food Insecurity: Patient denies food insecurity. WIC Referral: Patient is interested in referral to [facility]  Transportation: Patient denies transportation needs. Childcare: Discussed no children allowed at ultrasound appointments. Offered childcare services; patient declines childcare services at this time.  Send link to Pregnancy Navigators   Placed OB Box on problem list and updated  First visit review I reviewed new OB appt with pt. I explained she will have a pelvic exam, ob bloodwork with genetic screening, and PAP smear. Explained pt will be seen by Bonnie Bernheim, NP at first visit; encounter routed to appropriate provider. Explained that patient will be seen by pregnancy navigator following visit with provider. [facility] information placed in AVS.   Bonnie Cooper, CMA 12/30/2020  8:49 AM

## 2020-12-30 NOTE — MAU Provider Note (Addendum)
Bonnie Cooper is a 25 y/o G2P1001 [redacted]w[redacted]d with no pertinent medical history who presents for one week of lower abdominal cramping. History  Cramping began one week ago and is described as feeling like period cramping. It occurs every day on and off throughout the day. She can think of nothing that could have caused this, and nothing seems to make the pain worse. Moving makes the cramping better. She has not tried any medications as she is not sure what she can take. She denies any bleeding, discharge or abdominal pain. She has had some diarrhea over the last two days but feels like the cramps are not related to the diarrhea episodes.  CSN: [id number]  Arrival date and time: 12/30/20 6295   Event Date/Time   First Provider Initiated Contact with Patient 12/30/20 1046      Chief Complaint  Patient presents with   Abdominal Pain   Pelvic Pain   Abdominal Pain Associated symptoms include diarrhea, headaches and nausea. Pertinent negatives include no constipation, dysuria, fever or vomiting.  Pelvic Pain The patient's primary symptoms include pelvic pain. The patient's pertinent negatives include no vaginal discharge. Associated symptoms include abdominal pain, diarrhea, headaches and nausea. Pertinent negatives include no chills, constipation, dysuria, fever, sore throat or vomiting.   OB History     Gravida  2   Para  1   Term  1   Preterm      AB      Living  1      SAB      IAB      Ectopic      Multiple  0   Live Births  1           Past Medical History:  Diagnosis Date   Anxiety    Chlamydia    Depression    just recently, no meds. or tx   Headache    Sickle cell trait (HCC)    Tooth pain 09/25/2018    Past Surgical History:  Procedure Laterality Date   HERNIA REPAIR     navel  2001    Family History  Problem Relation Age of Onset   Diabetes Other     Social History   Tobacco Use   Smoking status: Never   Smokeless tobacco: Never  Vaping Use    Vaping Use: Every day   Substances: Flavoring   Devices: Hookah on occaision   Substance Use Topics   Alcohol use: Yes    Comment: SOCIALLY    Drug use: Not Currently    Types: Marijuana    Comment: last 12/27/20 per pt "2-3 hits" pt states it is for nausea    Allergies: No Known Allergies  Medications Prior to Admission  Medication Sig Dispense Refill Last Dose   Blood Pressure Monitoring DEVI 1 each by Does not apply route once a week. 1 each 0 Unknown   metroNIDAZOLE (METROGEL) 0.75 % vaginal gel APPLY ENOUGH GEL TO COAT VAGINAL WALLS TWICE WEEKLY FOR A TOTAL OF 6 MONTHS AFTER COMPLETING ORAL METRONIDAZOLE PRESCRIPTION 70 g 1 Unknown   Prenatal 27-1 MG TABS Take 1 tablet by mouth daily at 12 noon. 30 tablet 11 Unknown   Prenatal Vit-Fe Fumarate-FA (MULTIVITAMIN-PRENATAL) 27-0.8 MG TABS tablet Take 1 tablet by mouth daily at 12 noon.   Unknown    Review of Systems  Constitutional:  Negative for chills, diaphoresis and fever.  HENT:  Positive for congestion. Negative for sore throat.   Eyes:  Negative  for visual disturbance.  Respiratory:  Negative for cough, choking and shortness of breath.   Cardiovascular:  Negative for chest pain and leg swelling.  Gastrointestinal:  Positive for abdominal pain, diarrhea and nausea. Negative for constipation and vomiting.  Genitourinary:  Positive for pelvic pain. Negative for difficulty urinating, dysuria, vaginal bleeding and vaginal discharge.  Neurological:  Positive for headaches. Negative for weakness.  Physical Exam   Blood pressure 120/88, pulse 71, temperature 98.3 F (36.8 C), temperature source Oral, resp. rate 17, height 5\' 3"  (1.6 m), weight 78.1 kg, last menstrual period 09/28/2020, SpO2 100 %.  Physical Exam HENT:     Head: Normocephalic and atraumatic.  Eyes:     Extraocular Movements: Extraocular movements intact.  Cardiovascular:     Rate and Rhythm: Normal rate and regular rhythm.     Heart sounds: Normal heart  sounds.  Pulmonary:     Effort: Pulmonary effort is normal.     Breath sounds: Normal breath sounds.  Abdominal:     General: Abdomen is flat. Bowel sounds are normal.     Palpations: Abdomen is soft.     Tenderness: There is no abdominal tenderness. There is no guarding or rebound.  Musculoskeletal:     Right lower leg: No edema.     Left lower leg: No edema.  Skin:    General: Skin is warm and dry.  Neurological:     Mental Status: She is alert.  Psychiatric:        Mood and Affect: Mood normal.        Behavior: Behavior normal.    MAU Course  Procedures  MDM 10:19 FHR 119 on doppler 11:00 Given 1000 mg of tylenol, UA collected and pending. 1:30 PM Ultrasound shows intrauterine pregnancy.  Assessment and Plan  Bonnie Cooper is a 25 y/o G2P1001 [redacted]w[redacted]d with no pertinent medical history who presents for one week of lower abdominal cramping.  Abdominal Cramping: Ddx Includes UTI, STI/Vaginitis, normal gestational cramping. Infectious cause less likely due to lack of itching, burning or dysuria. Vaginal swap completed in outpatient visit this morning, results pending. FHR of 119 and Ultrasound rules out ectopic pregnancy. UA is nonconcerning for infection. Patient will be discharged, and will receive results of outpatient testing and follow up with outpatient provider as appropriate.   [redacted]w[redacted]d 12/30/2020, 11:04 AM    CNM attestation:  I have seen and examined this patient and agree with above documentation in the medical student's note.   Bonnie Cooper is a 25 y.o. G2P1001 at [redacted]w[redacted]d reporting lower abdominal cramping that began one week ago. She describes pain as feeling like period cramping. It occurs every day on and off throughout the day. She can think of nothing that could have caused this, and nothing seems to make the pain worse. Moving makes the cramping better. She has not tried any medications as she is not sure what she can take. She denies any bleeding, discharge or  abdominal pain. She has had some diarrhea over the last two days but feels like the cramps are not related to the diarrhea episodes.  PE: Patient Vitals for the past 24 hrs:  BP Temp Temp src Pulse Resp SpO2 Height Weight  12/30/20 1423 (!) 123/95 -- -- 75 -- 99 % -- --  12/30/20 1040 120/88 -- -- 71 17 100 % -- --  12/30/20 1029 -- -- -- -- -- -- 5\' 3"  (1.6 m) 78.1 kg  12/30/20 1011 (!) 123/95 98.3 F (36.8  C) Oral 75 19 100 % -- --   Gen: calm comfortable, NAD HEENT: atraumatic, normocephalic, PERRL Resp: normal effort, no distress Heart: regular rate Abd: soft, non-tender Pelvic: deferred, patient had blind swabs collected at the office today Cervix: closed/thick   FHR:175 bpm via doppler   ROS, labs, PMH reviewed  Orders Placed This Encounter  Procedures   US OB Comp Less 14 Wks   Urinalysis, Routine w reflex microscopic Urine, Clean Catch   Discharge patient   Meds ordered this encounter  Medications   acetaminophen (TYLENOL) tablet 1,000 mg   US OB Comp Less 14 Wks  Result Date: 12/30/2020 CLINICAL DATA:  Abdominal pain.  Positive pregnancy test EXAM: OBSTETRIC <14 WK ULTRASOUND TECHNIQUE: Transabdominal ultrasound was performed for evaluation of the gestation as well as the maternal uterus and adnexal regions. COMPARISON:  None. FINDINGS: Intrauterine gestational sac: Single Yolk sac:  Visualized. Embryo:  Visualized. Cardiac Activity: Visualized. Heart Rate: 170 bpm CRL:   2.5 mm   9 w 1 d                  [place] EDC: 08/03/2021 Subchorionic hemorrhage:  None visualized. Maternal uterus/adnexae: Right ovarian corpus luteal cyst. Bilateral ovaries are otherwise normal in appearance. No free fluid is seen within the pelvis. IMPRESSION: 1. Single live intrauterine gestation measuring 9 weeks 1 day by crown-rump length. 2. Active embryonic heart tones at 170 BPM. Electronically Signed   By: Duanne Guess D.O.   On: 12/30/2020 13:38     MDM UA unremarkable Vaginal swabs not  collected as patient had in office today Doppler with FHT's present [place] with results as above.  Tylenol 1000mg  PO given, pain improved  Assessment: 1. Normal IUP (intrauterine pregnancy) on prenatal ultrasound, first trimester   2. Abdominal pain affecting pregnancy   3. [redacted] weeks gestation of pregnancy     Plan: - Discharge home in stable condition - Strict return precautions reviewed. - Follow-up as scheduled on 11/7 at [facility] - Return to MAU as needed   [facility], CNM 12/30/2020 2:37 PM

## 2021-01-03 LAB — CERVICOVAGINAL ANCILLARY ONLY
Bacterial Vaginitis (gardnerella): POSITIVE — AB
Candida Glabrata: NEGATIVE
Candida Vaginitis: NEGATIVE
Chlamydia: NEGATIVE
Comment: NEGATIVE
Comment: NEGATIVE
Comment: NEGATIVE
Comment: NEGATIVE
Comment: NEGATIVE
Comment: NORMAL
Neisseria Gonorrhea: NEGATIVE
Trichomonas: NEGATIVE

## 2021-01-07 ENCOUNTER — Other Ambulatory Visit: Payer: Self-pay | Admitting: Certified Nurse Midwife

## 2021-01-07 DIAGNOSIS — B9689 Other specified bacterial agents as the cause of diseases classified elsewhere: Secondary | ICD-10-CM

## 2021-01-07 MED ORDER — METRONIDAZOLE 0.75 % VA GEL: 1.0000 | Freq: Two times a day (BID) | VAGINAL | 0 refills | Status: DC

## 2021-01-17 ENCOUNTER — Encounter: Payer: Self-pay | Admitting: Nurse Practitioner

## 2021-01-17 ENCOUNTER — Ambulatory Visit (INDEPENDENT_AMBULATORY_CARE_PROVIDER_SITE_OTHER): Payer: Managed Care, Other (non HMO) | Admitting: Nurse Practitioner

## 2021-01-17 ENCOUNTER — Other Ambulatory Visit: Payer: Self-pay

## 2021-01-17 VITALS — BP 128/86 | HR 84 | Wt 171.1 lb

## 2021-01-17 DIAGNOSIS — Z3480 Encounter for supervision of other normal pregnancy, unspecified trimester: Secondary | ICD-10-CM

## 2021-01-17 DIAGNOSIS — E039 Hypothyroidism, unspecified: Secondary | ICD-10-CM

## 2021-01-17 DIAGNOSIS — Z3A11 11 weeks gestation of pregnancy: Secondary | ICD-10-CM

## 2021-01-17 DIAGNOSIS — O99280 Endocrine, nutritional and metabolic diseases complicating pregnancy, unspecified trimester: Secondary | ICD-10-CM

## 2021-01-17 DIAGNOSIS — O0990 Supervision of high risk pregnancy, unspecified, unspecified trimester: Secondary | ICD-10-CM

## 2021-01-17 DIAGNOSIS — I10 Essential (primary) hypertension: Secondary | ICD-10-CM | POA: Insufficient documentation

## 2021-01-17 LAB — POCT URINALYSIS DIP (DEVICE)
Bilirubin Urine: NEGATIVE
Glucose, UA: NEGATIVE mg/dL
Hgb urine dipstick: NEGATIVE
Ketones, ur: NEGATIVE mg/dL
Leukocytes,Ua: NEGATIVE
Nitrite: NEGATIVE
Protein, ur: NEGATIVE mg/dL
Specific Gravity, Urine: 1.025 (ref 1.005–1.030)
Urobilinogen, UA: 1 mg/dL (ref 0.0–1.0)
pH: 6.5 (ref 5.0–8.0)

## 2021-01-17 MED ORDER — ASPIRIN EC 81 MG PO TBEC: 81.0000 mg | DELAYED_RELEASE_TABLET | Freq: Every day | ORAL | 11 refills | Status: DC

## 2021-01-17 NOTE — Progress Notes (Signed)
Subjective:   Bonnie Cooper is a 25 y.o. G2P1001 at 43w5dby early ultrasound being seen today for her first obstetrical visit.  Her obstetrical history is significant for  chronic hypertension and hx of hypothyroidism - stopped taken medication without MD approval . Patient does intend to breast feed. Pregnancy history fully reviewed.  Patient reports no complaints.  HISTORY: OB History  Gravida Para Term Preterm AB Living  _0 0 0 1  SAB IAB Ectopic Multiple Live Births  0 0 0 0 1    # Outcome Date GA Lbr Len/2nd Weight Sex Delivery Anes PTL Lv  2 Current           1 Term 02/09/16 357w1d4:20 / 02:03 6 lb 6.8 oz (2.915 kg) M Vag-Spont EPI  LIV     Name: Rupard,BOY Fionnuala     Apgar1: 6  Apgar5: 8   Past Medical History:  Diagnosis Date   Anxiety    Chlamydia    Depression    just recently, no meds. or tx   Headache    [place]   Tooth pain 09/25/2018   Past Surgical History:  Procedure Laterality Date   HERNIA REPAIR     navel  2001   Family History  Problem Relation Age of Onset   Diabetes Other    Social History   Tobacco Use   Smoking status: Never   Smokeless tobacco: Never  Vaping Use   Vaping Use: Never used  Substance Use Topics   Alcohol use: Not Currently    Comment: SOCIALLY    Drug use: Not Currently    Types: Marijuana    Comment: last 12/27/20 per pt "2-3 hits" pt states it is for nausea   No Known Allergies Current Outpatient Medications on File Prior to Visit  Medication Sig Dispense Refill   Prenatal 27-1 MG TABS Take 1 tablet by mouth daily at 12 noon. 30 tablet 11   Blood Pressure Monitoring DEVI 1 each by Does not apply route once a week. (Patient not taking: Reported on 01/17/2021) 1 each 0   No current facility-administered medications on file prior to visit.     Exam   Vitals:   01/17/21 0959  BP: 128/86  Pulse: 84  Weight: 171 lb 1.6 oz (77.6 kg)   Fetal Heart Rate (bpm): 165  Uterus:     Pelvic Exam:  Perineum: no hemorrhoids, normal perineum   Vulva: normal external genitalia, no lesions   Vagina:  normal mucosa, normal discharge   Cervix: no lesions and normal, pap smear done.    Adnexa: normal adnexa and no mass, fullness, tenderness   Bony Pelvis: average  System: General: well-developed, well-nourished female in no acute distress   Breast:  normal appearance, no masses or tenderness   Skin: normal coloration and turgor, no rashes   Neurologic: oriented, normal, negative, normal mood   Extremities: normal strength, tone, and muscle mass, ROM of all joints is normal   HEENT extraocular movement intact and sclera clear, anicteric   Mouth/Teeth mucous membranes moist, pharynx normal without lesions and dental hygiene good   Neck supple and no masses, normal thyroid   Cardiovascular: regular rate and rhythm   Respiratory:  no respiratory distress, normal breath sounds   Abdomen: soft, non-tender; no masses,  no organomegaly     Assessment:   Pregnancy: G2P1001 Patient Active Problem List   Diagnosis Date Noted   Chronic hypertension  01/17/2021   Supervision of high risk pregnancy, antepartum 12/30/2020   Hypothyroid in pregnancy, antepartum 02/18/2019   Major depressive disorder 11/19/2018   Migraine headache without aura 08/06/2018     Plan:  1. Supervision of high risk pregnancy, antepartum Accompanied by FOB today No questions voiced Had MAU visit for pain on 12-30-20 - pain has resolved and no problems at this time. Has had a history of depression in which she took medication.  No meds at this time.  Will refer to [facility] and client is agreeable to this visit.  2. Chronic hypertension Previous visits to PCP show elevated BP several times.  No treatment was prescribed for her 04-01-20 130/93  and 03-03-20 142/87  and   01-08-20  143/97  - Comp Met (CMET) - Protein / creatinine ratio, urine  3. Hypothyroid in pregnancy, antepartum Diagnosed 02-18-2019 - stopped medication  on her own and has not really had follow up since stopping her medication.  Slightly abnormal labs 10-28-19. Will check labs today  4. Supervision of other normal pregnancy, antepartum  - CBC/D/Plt+RPR+Rh+ABO+RubIgG... - Genetic Screening - Hemoglobin A1c - Culture, OB Urine - TSH - T3 - T4, free - Ambulatory referral to [place]   Initial labs drawn. Continue prenatal vitamins. Genetic Screening discussed, NIPS: ordered. Ultrasound discussed; fetal anatomic survey: ordered. Problem list reviewed and updated. The nature of [place] with multiple MDs and other Advanced Practice Providers was explained to patient; also emphasized that residents, students are part of our team. Routine obstetric precautions reviewed. Return in about 4 weeks (around 02/14/2021) for in person ROB.  Total face-to-face time with patient: 40 minutes.  Over 50% of encounter was spent on counseling and coordination of care.     Earlie Server, FNP Family Nurse Practitioner, [facility] for [employer], [employer] 01/17/2021 12:49 PM

## 2021-01-17 NOTE — Patient Instructions (Signed)
Safe Medications in Pregnancy  ° °Acne: °Benzoyl Peroxide °Salicylic Acid ° °Backache/Headache: °Tylenol: 2 regular strength every 4 hours OR °             2 Extra strength every 6 hours ° °Colds/Coughs/Allergies: °Benadryl (alcohol free) 25 mg every 6 hours as needed °Breath right strips °Claritin °Cepacol throat lozenges °Chloraseptic throat spray °Cold-Eeze- up to three times per day °Cough drops, alcohol free °Flonase  °Guaifenesin °Mucinex °Robitussin DM (plain only, alcohol free) °Saline nasal spray/drops °Sudafed (pseudoephedrine) & Actifed ** use only after [redacted] weeks gestation and if you do not have high blood pressure °Tylenol °Vicks Vaporub °Zinc lozenges °Zyrtec  ° °Constipation: °Colace °Ducolax suppositories °Fleet enema °Glycerin suppositories °Metamucil °Milk of magnesia °Miralax °Senokot °Smooth move tea ° °Diarrhea: °Kaopectate °Imodium A-D ° °*NO pepto Bismol ° °Hemorrhoids: °Anusol °Anusol HC °Preparation H °Tucks ° °Indigestion: °Tums °Maalox °Mylanta °Zantac  °Pepcid ° °Insomnia: °Benadryl (alcohol free) 25mg every 6 hours as needed °Tylenol PM °Unisom, no Gelcaps ° °Leg Cramps: °Tums °MagGel ° °Nausea/Vomiting:  °Bonine °Dramamine °Emetrol °Ginger extract °Sea bands °Meclizine  °Nausea medication to take during pregnancy:  °Unisom (doxylamine succinate 25 mg tablets) Take one tablet daily at bedtime. If symptoms are not adequately controlled, the dose can be increased to a maximum recommended dose of two tablets daily (1/2 tablet in the morning, 1/2 tablet mid-afternoon and one at bedtime). °Vitamin B6 100mg tablets. Take one tablet twice a day (up to 200 mg per day). ° °Skin Rashes: °Aveeno products °Benadryl cream or 25mg every 6 hours as needed °Calamine Lotion °1% cortisone cream ° °Yeast infection: °Gyne-lotrimin 7 °Monistat 7 ° ° °**If taking multiple medications, please check labels to avoid duplicating the same active ingredients °**take medication as directed on the label °** Do not  exceed 4000 mg of tylenol in 24 hours °**Do not take medications that contain aspirin or ibuprofen ° °  °

## 2021-01-18 LAB — HCV INTERPRETATION

## 2021-01-18 LAB — COMPREHENSIVE METABOLIC PANEL
ALT: 10 IU/L (ref 0–32)
AST: 14 IU/L (ref 0–40)
Albumin/Globulin Ratio: 1.6 (ref 1.2–2.2)
Albumin: 4.1 g/dL (ref 3.9–5.0)
Alkaline Phosphatase: 50 IU/L (ref 44–121)
BUN/Creatinine Ratio: 9 (ref 9–23)
BUN: 7 mg/dL (ref 6–20)
Bilirubin Total: 0.3 mg/dL (ref 0.0–1.2)
CO2: 22 mmol/L (ref 20–29)
Calcium: 9.7 mg/dL (ref 8.7–10.2)
Chloride: 103 mmol/L (ref 96–106)
Creatinine, Ser: 0.79 mg/dL (ref 0.57–1.00)
Globulin, Total: 2.6 g/dL (ref 1.5–4.5)
Glucose: 77 mg/dL (ref 70–99)
Potassium: 4.1 mmol/L (ref 3.5–5.2)
Sodium: 137 mmol/L (ref 134–144)
Total Protein: 6.7 g/dL (ref 6.0–8.5)
eGFR: 106 mL/min/{1.73_m2} (ref >59)

## 2021-01-18 LAB — CBC/D/PLT+RPR+RH+ABO+RUBIGG...
Antibody Screen: NEGATIVE
Basophils Absolute: 0 10*3/uL (ref 0.0–0.2)
Basos: 1 %
EOS (ABSOLUTE): 0.1 10*3/uL (ref 0.0–0.4)
Eos: 2 %
HCV Ab: <0.1 s/co ratio (ref 0.0–0.9)
HIV Screen 4th Generation wRfx: NONREACTIVE
Hematocrit: 34.4 % (ref 34.0–46.6)
Hemoglobin: 11.9 g/dL (ref 11.1–15.9)
Hepatitis B Surface Ag: NEGATIVE
Immature Grans (Abs): 0 10*3/uL (ref 0.0–0.1)
Immature Granulocytes: 0 %
Lymphocytes Absolute: 1.3 10*3/uL (ref 0.7–3.1)
Lymphs: 31 %
MCH: 32.1 pg (ref 26.6–33.0)
MCHC: 34.6 g/dL (ref 31.5–35.7)
MCV: 93 fL (ref 79–97)
Monocytes Absolute: 0.4 10*3/uL (ref 0.1–0.9)
Monocytes: 8 %
Neutrophils Absolute: 2.6 10*3/uL (ref 1.4–7.0)
Neutrophils: 58 %
Platelets: 195 10*3/uL (ref 150–450)
RBC: 3.71 x10E6/uL — ABNORMAL LOW (ref 3.77–5.28)
RDW: 13.2 % (ref 11.7–15.4)
RPR Ser Ql: NONREACTIVE
Rh Factor: POSITIVE
Rubella Antibodies, IGG: 17.6 index (ref >0.99)
WBC: 4.4 10*3/uL (ref 3.4–10.8)

## 2021-01-18 LAB — TSH: TSH: 5.46 u[IU]/mL — ABNORMAL HIGH (ref 0.450–4.500)

## 2021-01-18 LAB — T3: T3, Total: 180 ng/dL (ref 71–180)

## 2021-01-18 LAB — HEMOGLOBIN A1C
Est. average glucose Bld gHb Est-mCnc: 88 mg/dL
Hgb A1c MFr Bld: 4.7 % — ABNORMAL LOW (ref 4.8–5.6)

## 2021-01-18 LAB — T4, FREE: Free T4: 0.68 ng/dL — ABNORMAL LOW (ref 0.82–1.77)

## 2021-01-18 MED ORDER — LEVOTHYROXINE SODIUM 50 MCG PO TABS: 50.0000 ug | ORAL_TABLET | Freq: Every day | ORAL | 1 refills | Status: DC

## 2021-01-18 NOTE — Addendum Note (Signed)
Addended by: Currie Paris on: 01/18/2021 09:43 AM   Modules accepted: Orders

## 2021-01-19 ENCOUNTER — Telehealth: Payer: Self-pay | Admitting: Clinical

## 2021-01-19 NOTE — Telephone Encounter (Signed)
Left HIPPA-compliant message to call back Asher Muir from [facility] [employer] [facility] at  [phone] [facility] office); left MyChart message for pt.

## 2021-01-20 LAB — PROTEIN / CREATININE RATIO, URINE
Creatinine, Urine: 137.8 mg/dL
Protein, Ur: 17 mg/dL
Protein/Creat Ratio: 123 mg/g creat (ref 0–200)

## 2021-01-20 LAB — CULTURE, OB URINE

## 2021-01-20 LAB — URINE CULTURE, OB REFLEX

## 2021-01-24 ENCOUNTER — Telehealth: Payer: Self-pay | Admitting: Family Medicine

## 2021-01-24 NOTE — Telephone Encounter (Signed)
Patient left VM on nurse line requesting genetic screening results. Returned call to patient. Panorama result normal; fetal sex is female. Results given to patient. Explained that Horizon carrier screening has not resulted at this time.

## 2021-01-24 NOTE — Telephone Encounter (Signed)
Calling for test results ?

## 2021-01-29 ENCOUNTER — Inpatient Hospital Stay (HOSPITAL_COMMUNITY)
Admission: AD | Admit: 2021-01-29 | Discharge: 2021-01-29 | Disposition: A | Discharge status: Home / Self Care | Payer: Managed Care, Other (non HMO) | Attending: Family Medicine | Admitting: Family Medicine

## 2021-01-29 ENCOUNTER — Encounter (HOSPITAL_COMMUNITY): Payer: Self-pay | Admitting: Family Medicine

## 2021-01-29 ENCOUNTER — Other Ambulatory Visit: Payer: Self-pay

## 2021-01-29 DIAGNOSIS — O2341 Unspecified infection of urinary tract in pregnancy, first trimester: Secondary | ICD-10-CM | POA: Diagnosis not present

## 2021-01-29 DIAGNOSIS — O26891 Other specified pregnancy related conditions, first trimester: Secondary | ICD-10-CM

## 2021-01-29 DIAGNOSIS — O99511 Diseases of the respiratory system complicating pregnancy, first trimester: Secondary | ICD-10-CM | POA: Diagnosis not present

## 2021-01-29 DIAGNOSIS — J101 Influenza due to other identified influenza virus with other respiratory manifestations: Secondary | ICD-10-CM | POA: Insufficient documentation

## 2021-01-29 DIAGNOSIS — Z3A13 13 weeks gestation of pregnancy: Secondary | ICD-10-CM | POA: Diagnosis not present

## 2021-01-29 DIAGNOSIS — Z20822 Contact with and (suspected) exposure to covid-19: Secondary | ICD-10-CM | POA: Diagnosis not present

## 2021-01-29 DIAGNOSIS — J111 Influenza due to unidentified influenza virus with other respiratory manifestations: Secondary | ICD-10-CM | POA: Diagnosis not present

## 2021-01-29 LAB — URINALYSIS, ROUTINE W REFLEX MICROSCOPIC
Bilirubin Urine: NEGATIVE
Glucose, UA: NEGATIVE mg/dL
Hgb urine dipstick: NEGATIVE
Ketones, ur: 80 mg/dL — AB
Nitrite: POSITIVE — AB
Protein, ur: 30 mg/dL — AB
Specific Gravity, Urine: 1.015 (ref 1.005–1.030)
pH: 5 (ref 5.0–8.0)

## 2021-01-29 LAB — RESP PANEL BY RT-PCR (FLU A&B, COVID) ARPGX2
Influenza A by PCR: POSITIVE — AB
Influenza B by PCR: NEGATIVE
SARS Coronavirus 2 by RT PCR: NEGATIVE

## 2021-01-29 MED ORDER — OSELTAMIVIR PHOSPHATE 75 MG PO CAPS: 75.0000 mg | ORAL_CAPSULE | Freq: Two times a day (BID) | ORAL | 0 refills | Status: AC

## 2021-01-29 MED ORDER — ACETAMINOPHEN 500 MG PO TABS
1000.0000 mg | ORAL_TABLET | Freq: Once | ORAL | Status: AC
  Administered 2021-01-29: 1000 mg via ORAL
  Filled 2021-01-29: qty 2

## 2021-01-29 MED ORDER — CEFADROXIL 500 MG PO CAPS: 500.0000 mg | ORAL_CAPSULE | Freq: Two times a day (BID) | ORAL | 0 refills | Status: AC

## 2021-01-29 NOTE — MAU Provider Note (Signed)
History     CSN: [id number]  Arrival date and time: 01/29/21 1844   Event Date/Time   First Provider Initiated Contact with Patient 01/29/21 1944      Chief Complaint  Patient presents with   Generalized Body Aches   Cough   Chills   Headache   HPI Bonnie Cooper is a 25 y.o. G2P1001 at [redacted]w[redacted]d who presents with chills, cough, and body aches. Symptoms started on Thursday. States her child was sick as well. Did not get flu vaccine this season. Received J&J covid vaccine last year.  Symptoms started with sore throat. Since yesterday she has had body aches & headaches. Has not checked temperature at home. Also reports non productive cough. Rates pain 10/10. Hasn't treated symptoms.  Denies chest pain, shortness of breath, ear pain, abdominal pain, vaginal bleeding, nausea, or diarrhea.   OB History     Gravida  2   Para  1   Term  1   Preterm  0   AB  0   Living  1      SAB  0   IAB  0   Ectopic  0   Multiple  0   Live Births  1           Past Medical History:  Diagnosis Date   Anxiety    Chlamydia    Depression    just recently, no meds. or tx   Headache    Sickle cell trait (HCC)    Tooth pain 09/25/2018    Past Surgical History:  Procedure Laterality Date   HERNIA REPAIR     navel  2001    Family History  Problem Relation Age of Onset   Diabetes Other     Social History   Tobacco Use   Smoking status: Never   Smokeless tobacco: Never  Vaping Use   Vaping Use: Never used  Substance Use Topics   Alcohol use: Not Currently    Comment: SOCIALLY    Drug use: Not Currently    Types: Marijuana    Comment: last 12/27/20 per pt "2-3 hits" pt states it is for nausea    Allergies: No Known Allergies  No medications prior to admission.    Review of Systems  Constitutional:  Positive for chills. Negative for fever (didn't check temp).  HENT:  Positive for sore throat. Negative for congestion and ear pain.   Eyes: Negative.    Respiratory:  Positive for cough. Negative for shortness of breath.   Cardiovascular:  Negative for chest pain.  Gastrointestinal: Negative.   Genitourinary: Negative.   Musculoskeletal:  Positive for myalgias.  Neurological:  Positive for headaches.  Physical Exam   Blood pressure 115/75, pulse 97, temperature 99.1 F (37.3 C), temperature source Oral, resp. rate 17, height 5\' 3"  (1.6 m), weight 75.4 kg, last menstrual period 09/28/2020, SpO2 97 %.  Physical Exam Vitals and nursing note reviewed.  Constitutional:      Appearance: She is well-developed. She is ill-appearing. She is not toxic-appearing or diaphoretic.  HENT:     Head: Normocephalic and atraumatic.  Eyes:     General: No scleral icterus.    Pupils: Pupils are equal, round, and reactive to light.  Cardiovascular:     Rate and Rhythm: Regular rhythm. Tachycardia present.     Heart sounds: Normal heart sounds.  Pulmonary:     Effort: Pulmonary effort is normal. No respiratory distress.     Breath sounds: Normal breath sounds.  No wheezing.  Musculoskeletal:     Cervical back: Normal range of motion and neck supple.  Lymphadenopathy:     Cervical: No cervical adenopathy.  Skin:    General: Skin is warm and dry.  Neurological:     Mental Status: She is alert.  Psychiatric:        Mood and Affect: Mood normal.        Behavior: Behavior normal.    MAU Course  Procedures Results for orders placed or performed during the hospital encounter of 01/29/21 (from the past 24 hour(s))  Resp Panel by RT-PCR (Flu A&B, Covid) Nasopharyngeal Swab     Status: Abnormal   Collection Time: 01/29/21  7:24 PM   Specimen: Nasopharyngeal Swab; Nasopharyngeal(NP) swabs in vial transport medium  Result Value Ref Range   SARS Coronavirus 2 by RT PCR NEGATIVE NEGATIVE   Influenza A by PCR POSITIVE (A) NEGATIVE   Influenza B by PCR NEGATIVE NEGATIVE  Urinalysis, Routine w reflex microscopic     Status: Abnormal   Collection Time:  01/29/21  7:36 PM  Result Value Ref Range   Color, Urine YELLOW YELLOW   APPearance CLOUDY (A) CLEAR   Specific Gravity, Urine 1.015 1.005 - 1.030   pH 5.0 5.0 - 8.0   Glucose, UA NEGATIVE NEGATIVE mg/dL   Hgb urine dipstick NEGATIVE NEGATIVE   Bilirubin Urine NEGATIVE NEGATIVE   Ketones, ur 80 (A) NEGATIVE mg/dL   Protein, ur 30 (A) NEGATIVE mg/dL   Nitrite POSITIVE (A) NEGATIVE   Leukocytes,Ua MODERATE (A) NEGATIVE   RBC / HPF 6-10 0 - 5 RBC/hpf   WBC, UA 21-50 0 - 5 WBC/hpf   Bacteria, UA MANY (A) NONE SEEN   Squamous Epithelial / LPF 21-50 0 - 5   Mucus PRESENT     MDM Patient presents with body aches, cough, and fever. Fetal tachycardia noted on doppler & her temperature is 100.7. Covid/flu swab collected & patient given 1 gm of tylenol.  Temp & fetal heart rate improved after medication. Swab positive for flu. Symptoms started within the last 2 days - will send home with tamiflu.   U/a positive for nitrites. Patient denies urinary complaints or flank pain. No CVA tenderness on exam. Fever likely related to flu. Will rx duricef & send urine for culture.  Assessment and Plan   1. [redacted] weeks gestation of pregnancy   2. Influenza A  -rx tamiflu -symptomatic tx - given list of OTC meds safe in pregnancy -tylenol prn fever & body aches   3. UTI (urinary tract infection) during pregnancy, first trimester  -rx duricef -reviewed signs of pyelo & reasons to return to MAU -urine culture pending     Bonnie Cooper 01/30/2021, 8:28 AM

## 2021-01-29 NOTE — MAU Note (Signed)
..  Bonnie Cooper is a 25 y.o. at [redacted]w[redacted]d here in MAU reporting: chills, cough, body aches, and headache for three days. She has not taken any medication because she is unsure what she can take during pregnancy.  Denies abdominal pain or vaginal bleeding.  Pain score: 10/10 body aches. 10/10 headache Vitals:   01/29/21 1916  BP: 124/77  Pulse: (!) 122  Resp: 18  Temp: (!) 100.7 F (38.2 C)  SpO2: 97%     FHT:199

## 2021-01-29 NOTE — Discharge Instructions (Signed)

## 2021-02-01 LAB — CULTURE, OB URINE
Culture: >=100000 — AB
Special Requests: NORMAL

## 2021-02-02 ENCOUNTER — Telehealth: Payer: Self-pay | Admitting: Certified Nurse Midwife

## 2021-02-02 ENCOUNTER — Other Ambulatory Visit: Payer: Self-pay

## 2021-02-02 DIAGNOSIS — O2342 Unspecified infection of urinary tract in pregnancy, second trimester: Secondary | ICD-10-CM

## 2021-02-02 NOTE — Telephone Encounter (Signed)
Pt already being treated for UTI with Duricef. Encounter opened in error.

## 2021-02-08 ENCOUNTER — Other Ambulatory Visit: Payer: Self-pay

## 2021-02-08 ENCOUNTER — Ambulatory Visit: Payer: Managed Care, Other (non HMO) | Attending: Certified Nurse Midwife

## 2021-02-08 ENCOUNTER — Other Ambulatory Visit: Payer: Self-pay | Admitting: Certified Nurse Midwife

## 2021-02-08 DIAGNOSIS — Z148 Genetic carrier of other disease: Secondary | ICD-10-CM | POA: Insufficient documentation

## 2021-02-08 DIAGNOSIS — O10012 Pre-existing essential hypertension complicating pregnancy, second trimester: Secondary | ICD-10-CM | POA: Insufficient documentation

## 2021-02-08 DIAGNOSIS — O99282 Endocrine, nutritional and metabolic diseases complicating pregnancy, second trimester: Secondary | ICD-10-CM | POA: Diagnosis not present

## 2021-02-08 DIAGNOSIS — E039 Hypothyroidism, unspecified: Secondary | ICD-10-CM | POA: Insufficient documentation

## 2021-02-08 DIAGNOSIS — Z3480 Encounter for supervision of other normal pregnancy, unspecified trimester: Secondary | ICD-10-CM | POA: Diagnosis present

## 2021-02-08 DIAGNOSIS — Z3689 Encounter for other specified antenatal screening: Secondary | ICD-10-CM

## 2021-02-08 DIAGNOSIS — Z3A14 14 weeks gestation of pregnancy: Secondary | ICD-10-CM | POA: Insufficient documentation

## 2021-02-14 ENCOUNTER — Ambulatory Visit (INDEPENDENT_AMBULATORY_CARE_PROVIDER_SITE_OTHER): Payer: Managed Care, Other (non HMO) | Admitting: Obstetrics and Gynecology

## 2021-02-14 ENCOUNTER — Encounter: Payer: Self-pay | Admitting: Obstetrics and Gynecology

## 2021-02-14 ENCOUNTER — Other Ambulatory Visit: Payer: Self-pay

## 2021-02-14 VITALS — BP 123/84 | HR 101 | Wt 168.7 lb

## 2021-02-14 DIAGNOSIS — I10 Essential (primary) hypertension: Secondary | ICD-10-CM

## 2021-02-14 DIAGNOSIS — O99280 Endocrine, nutritional and metabolic diseases complicating pregnancy, unspecified trimester: Secondary | ICD-10-CM

## 2021-02-14 DIAGNOSIS — O0990 Supervision of high risk pregnancy, unspecified, unspecified trimester: Secondary | ICD-10-CM

## 2021-02-14 DIAGNOSIS — E039 Hypothyroidism, unspecified: Secondary | ICD-10-CM

## 2021-02-14 NOTE — Progress Notes (Signed)
Pt states has been having a lot of headaches for the past week, baby aspirin nor Tylenol works. Has to go to sleep for it to stop.

## 2021-02-14 NOTE — Patient Instructions (Signed)

## 2021-02-14 NOTE — Progress Notes (Signed)
Subjective:  Bonnie Cooper is a 25 y.o. G2P1001 at [redacted]w[redacted]d being seen today for ongoing prenatal care.  She is currently monitored for the following issues for this high-risk pregnancy and has Migraine headache without aura; Major depressive disorder; Hypothyroid in pregnancy, antepartum; Supervision of high risk pregnancy, antepartum; and Chronic hypertension on their problem list.  Patient reports occ HA, goes away with sleep.  Contractions: Not present. Vag. Bleeding: None.  Movement: Absent. Denies leaking of fluid.   The following portions of the patient's history were reviewed and updated as appropriate: allergies, current medications, past family history, past medical history, past social history, past surgical history and problem list. Problem list updated.  Objective:   Vitals:   02/14/21 1107  BP: 123/84  Pulse: (!) 101  Weight: 168 lb 11.2 oz (76.5 kg)    Fetal Status: Fetal Heart Rate (bpm): 162   Movement: Absent     General:  Alert, oriented and cooperative. Patient is in no acute distress.  Skin: Skin is warm and dry. No rash noted.   Cardiovascular: Normal heart rate noted  Respiratory: Normal respiratory effort, no problems with respiration noted  Abdomen: Soft, gravid, appropriate for gestational age. Pain/Pressure: Absent     Pelvic:  Cervical exam deferred        Extremities: Normal range of motion.  Edema: None  Mental Status: Normal mood and affect. Normal behavior. Normal judgment and thought content.   Urinalysis:      Assessment and Plan:  Pregnancy: G2P1001 at [redacted]w[redacted]d  1. Supervision of high risk pregnancy, antepartum Stable AFP next visit Anatomy scan today  2. Chronic hypertension BP stable Continue with qd BASA Advised to pick up BP cuff  3. Hypothyroid in pregnancy, antepartum Stable Continue with current management  Preterm labor symptoms and general obstetric precautions including but not limited to vaginal bleeding, contractions, leaking of  fluid and fetal movement were reviewed in detail with the patient. Please refer to After Visit Summary for other counseling recommendations.  Return in about 4 weeks (around 03/14/2021) for OB visit, face to face, any provider.   Hermina Staggers, MD

## 2021-02-22 ENCOUNTER — Telehealth: Payer: Self-pay

## 2021-02-22 NOTE — Telephone Encounter (Signed)
Called pt to give [employer] genetic screening results. Patient tested positive as a carrier for Sickle Cell Disease. VM left stating I am calling with results and will send MyChart message.

## 2021-02-28 ENCOUNTER — Encounter: Payer: Self-pay | Admitting: Nurse Practitioner

## 2021-02-28 DIAGNOSIS — D573 Sickle-cell trait: Secondary | ICD-10-CM | POA: Insufficient documentation

## 2021-03-13 NOTE — L&D Delivery Note (Addendum)
Vaginal Delivery Note ? ?Pre-procedure Diagnosis: SIUP @ [redacted]w[redacted]d ?Indications: 26 y.o. G2P1001 Estimated Date of Delivery: 08/03/21 here today for vaginal bleeding and spontaneous onset of labor. Her pregnancy has been complicated by cHTN, hypothyroidism, migraines without aura,GER, and [place] trait . Her labor course was complicated by NRFHT resulting in VAVD.   ?Post-procedure Diagnosis: SIUP @ [redacted]w[redacted]d ; same ? ?Provider: Ardyth Cooper, SNM; Scheryl Darter MD ? ?Anesthesia: epidural ? ?Complications: VAVD ? ?Delivery Estimated Blood Loss (EBL): 120  mL ? ?Transfusions: none ? ?Pathology: placenta to l&d routinely, arterial cord gases collected ? ?Labor Events: ?Rupture date: 07/22/2021 , at 3:11 AM .  ?Rupture type: Artificial [2];Intact [6];Bulging bag of water [8]  ?Fluid characteristic: Clear [1]  ?Interval from ROM to Delivery: 0h 40m ?Induction: None [1]  ?Augmentation: AROM [2]  ?Sex: M ? ?Delivery Information for bbay boy of Bonnie Cooper ?Time of Birth: 4:04 AM  ?Baby Weight:  pending ? ?APGARS One minute Five minutes ?Totals: 5  9   ?Cord pH:  7.07 ?Newborn is SGA and well. Patient plans to breastfeed.   ? ?Procedure:  ?In room watching pt/EFM strip when she c/o more intense rectal pressure.  Noted to now be C/C/+2.  After several pushes, the FHR dropped to the 60s and was slow to return to baseline.  Following multiple resuscitative measures, Dr. Debroah Loop called to evaluate for VAVD. The patient was in lithotomy position, draped under in a routine fashion.   ?Dr. Debroah Loop performed Vacuum extraction of fetal head resulting in a viable female infant delivered over an intact supported perineum. No pop offs. Nuchal cord x's 1, delivered through. No dystocia. terminal meconium present. Nose and mouth suctioned with bulb; cord clamped and cut. The placenta was then delivered spontaneously and intact via CCT; Para March; 3VC. The uterine fundus was firm after uterine massage and with a 500 ml bolus of 20 units of pitocin. Inspection  revealed  an intact perineum . Patient tolerated the delivery well. Instrument, sponge, and needle counts were correct at the end of the procedure. ? ? ?Disposition: stable to transfer to [facility] ? ? ?Electronically Signed By: ?Bonnie Cooper, SNM ?07/22/21 4:18 AM   ?

## 2021-03-15 ENCOUNTER — Other Ambulatory Visit: Payer: Self-pay

## 2021-03-15 ENCOUNTER — Ambulatory Visit (INDEPENDENT_AMBULATORY_CARE_PROVIDER_SITE_OTHER): Payer: Managed Care, Other (non HMO) | Admitting: Student

## 2021-03-15 VITALS — BP 102/67 | HR 84 | Wt 170.9 lb

## 2021-03-15 DIAGNOSIS — E039 Hypothyroidism, unspecified: Secondary | ICD-10-CM

## 2021-03-15 DIAGNOSIS — Z3A19 19 weeks gestation of pregnancy: Secondary | ICD-10-CM

## 2021-03-15 DIAGNOSIS — O99280 Endocrine, nutritional and metabolic diseases complicating pregnancy, unspecified trimester: Secondary | ICD-10-CM

## 2021-03-15 DIAGNOSIS — O0990 Supervision of high risk pregnancy, unspecified, unspecified trimester: Secondary | ICD-10-CM

## 2021-03-15 NOTE — Progress Notes (Signed)
Patient ultrasound scheduled for 03/28/21 at 1:45 PM. Patient notified.

## 2021-03-15 NOTE — Progress Notes (Signed)
° °  PRENATAL VISIT NOTE  Subjective:  Bonnie Cooper is a 26 y.o. G2P1001 at [redacted]w[redacted]d being seen today for ongoing prenatal care.  She is currently monitored for the following issues for this high-risk pregnancy and has Migraine headache without aura; Major depressive disorder; Hypothyroid in pregnancy, antepartum; Supervision of high risk pregnancy, antepartum; Chronic hypertension; and Sickle cell trait ([place]) on their problem list.  Patient reports no complaints.  Contractions: Not present. Vag. Bleeding: None.  Movement: Present. Denies leaking of fluid.   The following portions of the patient's history were reviewed and updated as appropriate: allergies, current medications, past family history, past medical history, past social history, past surgical history and problem list.   Objective:   Vitals:   03/15/21 1549  BP: 102/67  Pulse: 84  Weight: 170 lb 14.4 oz (77.5 kg)    Fetal Status: Fetal Heart Rate (bpm): 150   Movement: Present     General:  Alert, oriented and cooperative. Patient is in no acute distress.  Skin: Skin is warm and dry. No rash noted.   Cardiovascular: Normal heart rate noted  Respiratory: Normal respiratory effort, no problems with respiration noted  Abdomen: Soft, gravid, appropriate for gestational age.  Pain/Pressure: Absent     Pelvic: Cervical exam deferred        Extremities: Normal range of motion.  Edema: None  Mental Status: Normal mood and affect. Normal behavior. Normal judgment and thought content.   Assessment and Plan:  Pregnancy: G2P1001 at [redacted]w[redacted]d 1. Supervision of high risk pregnancy, antepartum -patient's BP is normal today - AFP, Serum, Open Spina Bifida - [place] MFM OB FOLLOW UP; Future -keep taking baby ASA -keep taking synthroid 50 mcg: will check labs today Preterm labor symptoms and general obstetric precautions including but not limited to vaginal bleeding, contractions, leaking of fluid and fetal movement were reviewed in detail with the  patient. Please refer to After Visit Summary for other counseling recommendations.   Return in about 4 years (around 03/15/2025), or HROB with MD only.  Future Appointments  Date Time Provider [place]  03/28/2021  1:45 PM [facility] NURSE [facility]  03/28/2021  2:00 PM [facility]    Starr [place], CNM

## 2021-03-16 LAB — AFP, SERUM, OPEN SPINA BIFIDA
AFP MoM: 0.81
AFP Value: 46.2 ng/mL
Gest. Age on Collection Date: 19.6 weeks
Maternal Age At EDD: 27.1 yr
OSBR Risk 1 IN: 10000
Test Results:: NEGATIVE
Weight: 171 [lb_av]

## 2021-03-16 LAB — TSH+FREE T4
Free T4: 0.75 ng/dL — ABNORMAL LOW (ref 0.82–1.77)
TSH: 3.36 u[IU]/mL (ref 0.450–4.500)

## 2021-03-28 ENCOUNTER — Other Ambulatory Visit: Payer: Self-pay | Admitting: *Deleted

## 2021-03-28 ENCOUNTER — Other Ambulatory Visit: Payer: Self-pay | Admitting: Student

## 2021-03-28 ENCOUNTER — Other Ambulatory Visit: Payer: Self-pay

## 2021-03-28 ENCOUNTER — Ambulatory Visit: Payer: Managed Care, Other (non HMO) | Admitting: *Deleted

## 2021-03-28 ENCOUNTER — Ambulatory Visit: Payer: Managed Care, Other (non HMO) | Attending: Student

## 2021-03-28 VITALS — BP 113/72 | HR 89

## 2021-03-28 DIAGNOSIS — O0990 Supervision of high risk pregnancy, unspecified, unspecified trimester: Secondary | ICD-10-CM

## 2021-03-28 DIAGNOSIS — E039 Hypothyroidism, unspecified: Secondary | ICD-10-CM

## 2021-03-28 DIAGNOSIS — O10912 Unspecified pre-existing hypertension complicating pregnancy, second trimester: Secondary | ICD-10-CM

## 2021-04-11 ENCOUNTER — Ambulatory Visit (INDEPENDENT_AMBULATORY_CARE_PROVIDER_SITE_OTHER): Payer: Managed Care, Other (non HMO) | Admitting: Obstetrics & Gynecology

## 2021-04-11 ENCOUNTER — Other Ambulatory Visit: Payer: Self-pay

## 2021-04-11 VITALS — BP 112/73 | HR 90 | Wt 175.7 lb

## 2021-04-11 DIAGNOSIS — O0990 Supervision of high risk pregnancy, unspecified, unspecified trimester: Secondary | ICD-10-CM

## 2021-04-11 DIAGNOSIS — E039 Hypothyroidism, unspecified: Secondary | ICD-10-CM

## 2021-04-11 DIAGNOSIS — O99280 Endocrine, nutritional and metabolic diseases complicating pregnancy, unspecified trimester: Secondary | ICD-10-CM

## 2021-04-11 NOTE — Progress Notes (Signed)
Subjective:    Bonnie Cooper is a 26 y.o. female being seen today for her obstetrical visit. She is at [redacted]w[redacted]d gestation. Patient reports  daily headaches and takes tylenol, which leads to improvement sometimes . Fetal movement: normal.  Review of Systems:   Review of Systems  Eyes:  Negative for pain and visual disturbance.  Respiratory:  Negative for shortness of breath.   Cardiovascular:  Negative for chest pain.  Gastrointestinal:  Negative for abdominal pain.  Genitourinary:  Negative for vaginal bleeding and vaginal discharge.    Objective:    BP 112/73    Pulse 90    Wt 175 lb 11.2 oz (79.7 kg)    LMP 09/28/2020    BMI 31.12 kg/m   Physical Exam Vitals and nursing note reviewed.  Constitutional:      General: She is not in acute distress. HENT:     Head: Normocephalic and atraumatic.  Cardiovascular:     Rate and Rhythm: Normal rate and regular rhythm.  Pulmonary:     Effort: Pulmonary effort is normal.     Breath sounds: Normal breath sounds. No stridor. No wheezing or rhonchi.  Abdominal:     Tenderness: There is no abdominal tenderness.  Neurological:     General: No focal deficit present.     Mental Status: She is alert.  Psychiatric:        Mood and Affect: Mood normal.     Assessment:    Pregnancy:  G2P1001   Bonnie Cooper is a 26yo G2P1001 at [redacted]w[redacted]d being seen for an obstetrical visit doing well. She has been having good fetal movement and progressing well. Her blood pressures were elevated last year but have been stable during pregnancy, and she has taken daily ASA to reduce preeclampsia risk. She has no concerns at this time.  Plan:    Patient Active Problem List   Diagnosis Date Noted   Sickle cell trait ([facility]) 02/28/2021   Chronic hypertension 01/17/2021   Supervision of high risk pregnancy, antepartum 12/30/2020   Hypothyroid in pregnancy, antepartum 02/18/2019   Major depressive disorder 11/19/2018   Migraine headache without aura 08/06/2018    Supervision of high risk pregnancy in second trimester - Continue routine prenatal care - Fasting diabetes screen at next visit - Next ultrasound scheduled for 2/16 History of chronic hypertension - Continue low dose ASA daily - Continue to monitor blood pressures Daily headaches - Continue tylenol PRN   Follow up in 2 weeks.    Danie Chandler, MS3 04/11/21  Attestation of Attending Supervision of Medical Student: Evaluation and management procedures were performed by the medical student under my supervision and collaboration.  I have reviewed the student's note and chart, and I agree with the management and plan.  Emeterio Reeve, MD, [place] Attending [place], [facility]

## 2021-04-25 ENCOUNTER — Other Ambulatory Visit: Payer: Self-pay | Admitting: *Deleted

## 2021-04-25 ENCOUNTER — Ambulatory Visit: Payer: Managed Care, Other (non HMO) | Attending: Obstetrics

## 2021-04-25 ENCOUNTER — Encounter: Payer: Self-pay | Admitting: *Deleted

## 2021-04-25 ENCOUNTER — Ambulatory Visit: Payer: Managed Care, Other (non HMO) | Admitting: *Deleted

## 2021-04-25 ENCOUNTER — Other Ambulatory Visit: Payer: Self-pay

## 2021-04-25 VITALS — BP 108/65 | HR 101

## 2021-04-25 DIAGNOSIS — O99282 Endocrine, nutritional and metabolic diseases complicating pregnancy, second trimester: Secondary | ICD-10-CM

## 2021-04-25 DIAGNOSIS — Z3A25 25 weeks gestation of pregnancy: Secondary | ICD-10-CM | POA: Diagnosis not present

## 2021-04-25 DIAGNOSIS — O10012 Pre-existing essential hypertension complicating pregnancy, second trimester: Secondary | ICD-10-CM

## 2021-04-25 DIAGNOSIS — O10912 Unspecified pre-existing hypertension complicating pregnancy, second trimester: Secondary | ICD-10-CM | POA: Insufficient documentation

## 2021-04-25 DIAGNOSIS — E039 Hypothyroidism, unspecified: Secondary | ICD-10-CM | POA: Diagnosis present

## 2021-04-25 DIAGNOSIS — O0990 Supervision of high risk pregnancy, unspecified, unspecified trimester: Secondary | ICD-10-CM | POA: Diagnosis present

## 2021-04-25 DIAGNOSIS — O10919 Unspecified pre-existing hypertension complicating pregnancy, unspecified trimester: Secondary | ICD-10-CM

## 2021-05-09 ENCOUNTER — Ambulatory Visit (INDEPENDENT_AMBULATORY_CARE_PROVIDER_SITE_OTHER): Payer: Managed Care, Other (non HMO) | Admitting: Obstetrics and Gynecology

## 2021-05-09 ENCOUNTER — Other Ambulatory Visit: Payer: Managed Care, Other (non HMO)

## 2021-05-09 ENCOUNTER — Other Ambulatory Visit: Payer: Self-pay

## 2021-05-09 VITALS — BP 114/82 | HR 97 | Wt 177.0 lb

## 2021-05-09 DIAGNOSIS — Z23 Encounter for immunization: Secondary | ICD-10-CM

## 2021-05-09 DIAGNOSIS — K219 Gastro-esophageal reflux disease without esophagitis: Secondary | ICD-10-CM

## 2021-05-09 DIAGNOSIS — Z3A27 27 weeks gestation of pregnancy: Secondary | ICD-10-CM | POA: Diagnosis not present

## 2021-05-09 DIAGNOSIS — O0990 Supervision of high risk pregnancy, unspecified, unspecified trimester: Secondary | ICD-10-CM | POA: Diagnosis not present

## 2021-05-09 DIAGNOSIS — O99613 Diseases of the digestive system complicating pregnancy, third trimester: Secondary | ICD-10-CM

## 2021-05-09 DIAGNOSIS — E039 Hypothyroidism, unspecified: Secondary | ICD-10-CM

## 2021-05-09 DIAGNOSIS — D573 Sickle-cell trait: Secondary | ICD-10-CM

## 2021-05-09 DIAGNOSIS — I10 Essential (primary) hypertension: Secondary | ICD-10-CM

## 2021-05-09 DIAGNOSIS — O99280 Endocrine, nutritional and metabolic diseases complicating pregnancy, unspecified trimester: Secondary | ICD-10-CM

## 2021-05-09 MED ORDER — FAMOTIDINE 20 MG PO TABS: 20.0000 mg | ORAL_TABLET | Freq: Every day | ORAL | 1 refills | Status: DC

## 2021-05-09 NOTE — Progress Notes (Signed)
Tdap vaccine was administered into right deltoid without any complications. Patient is currently getting her "28 week" labs and is aware of ultrasound scheduled for March 13.  Ms. Moorefield has been having heartburn and would like to know what she can take that's safe for pregnancy  No further questions   Kale Rondeau, CMA    05/09/21

## 2021-05-09 NOTE — Progress Notes (Signed)
° °  PRENATAL VISIT NOTE  Subjective:  Bonnie Cooper is a 26 y.o. G2P1001 at [redacted]w[redacted]d being seen today for ongoing prenatal care.  She is currently monitored for the following issues for this high-risk pregnancy and has Migraine headache without aura; Major depressive disorder; Hypothyroid in pregnancy, antepartum; Supervision of high risk pregnancy, antepartum; Chronic hypertension; Sickle cell trait ([place]); and Gastroesophageal reflux during pregnancy in third trimester, antepartum on their problem list.  Patient doing well with no acute concerns today. She reports no complaints.  Contractions: Not present. Vag. Bleeding: None.  Movement: Present. Denies leaking of fluid.   The following portions of the patient's history were reviewed and updated as appropriate: allergies, current medications, past family history, past medical history, past social history, past surgical history and problem list. Problem list updated.  Objective:   Vitals:   05/09/21 0843  BP: 114/82  Pulse: 97  Weight: 177 lb (80.3 kg)    Fetal Status: Fetal Heart Rate (bpm): 163 Fundal Height: 26 cm Movement: Present     General:  Alert, oriented and cooperative. Patient is in no acute distress.  Skin: Skin is warm and dry. No rash noted.   Cardiovascular: Normal heart rate noted  Respiratory: Normal respiratory effort, no problems with respiration noted  Abdomen: Soft, gravid, appropriate for gestational age.  Pain/Pressure: Absent     Pelvic: Cervical exam deferred        Extremities: Normal range of motion.     Mental Status:  Normal mood and affect. Normal behavior. Normal judgment and thought content.   Assessment and Plan:  Pregnancy: G2P1001 at [redacted]w[redacted]d  1. [redacted] weeks gestation of pregnancy  - Tdap vaccine greater than or equal to 7yo IM  2. Chronic hypertension Unsure if pt is truly CHTN, review of BP shows no record of HTN this pregnancy  3. Hypothyroid in pregnancy, antepartum Last TSH was normal, T4 slightly  low, advise recheck of labs end of MArch  4. Supervision of high risk pregnancy, antepartum Continue routine care, third trimester labs today  5. Sickle cell trait ([place])   6. Gastroesophageal reflux during pregnancy in third trimester, antepartum Trial of pepcid for treatment of GERD - famotidine (PEPCID) 20 MG tablet; Take 1 tablet (20 mg total) by mouth daily.  Dispense: 30 tablet; Refill: 1  Preterm labor symptoms and general obstetric precautions including but not limited to vaginal bleeding, contractions, leaking of fluid and fetal movement were reviewed in detail with the patient.  Please refer to After Visit Summary for other counseling recommendations.   Return in about 2 weeks (around 05/23/2021) for [facility], in person.   Lynnda Shields, MD Faculty Attending Center for [facility]

## 2021-05-10 LAB — CBC
Hematocrit: 30.3 % — ABNORMAL LOW (ref 34.0–46.6)
Hemoglobin: 10.3 g/dL — ABNORMAL LOW (ref 11.1–15.9)
MCH: 32.4 pg (ref 26.6–33.0)
MCHC: 34 g/dL (ref 31.5–35.7)
MCV: 95 fL (ref 79–97)
Platelets: 198 10*3/uL (ref 150–450)
RBC: 3.18 x10E6/uL — ABNORMAL LOW (ref 3.77–5.28)
RDW: 12.9 % (ref 11.7–15.4)
WBC: 5.9 10*3/uL (ref 3.4–10.8)

## 2021-05-10 LAB — RPR: RPR Ser Ql: NONREACTIVE

## 2021-05-10 LAB — HIV ANTIBODY (ROUTINE TESTING W REFLEX): HIV Screen 4th Generation wRfx: NONREACTIVE

## 2021-05-12 ENCOUNTER — Encounter: Payer: Self-pay | Admitting: Family Medicine

## 2021-05-12 ENCOUNTER — Other Ambulatory Visit: Payer: Self-pay

## 2021-05-12 ENCOUNTER — Other Ambulatory Visit: Payer: Managed Care, Other (non HMO)

## 2021-05-12 DIAGNOSIS — O0990 Supervision of high risk pregnancy, unspecified, unspecified trimester: Secondary | ICD-10-CM

## 2021-05-13 LAB — GLUCOSE TOLERANCE, 2 HOURS W/ 1HR
Glucose, 1 hour: 119 mg/dL (ref 70–179)
Glucose, 2 hour: 103 mg/dL (ref 70–152)
Glucose, Fasting: 72 mg/dL (ref 70–91)

## 2021-05-15 ENCOUNTER — Encounter: Payer: Self-pay | Admitting: Obstetrics and Gynecology

## 2021-05-16 ENCOUNTER — Other Ambulatory Visit: Payer: Self-pay | Admitting: *Deleted

## 2021-05-16 DIAGNOSIS — O99891 Other specified diseases and conditions complicating pregnancy: Secondary | ICD-10-CM

## 2021-05-16 DIAGNOSIS — M549 Dorsalgia, unspecified: Secondary | ICD-10-CM

## 2021-05-16 NOTE — Progress Notes (Signed)
Orders entered for PT due to back pain ?Bonnie Cooper ?

## 2021-05-18 ENCOUNTER — Ambulatory Visit: Payer: Managed Care, Other (non HMO) | Attending: Obstetrics and Gynecology

## 2021-05-18 ENCOUNTER — Other Ambulatory Visit: Payer: Self-pay

## 2021-05-18 DIAGNOSIS — M549 Dorsalgia, unspecified: Secondary | ICD-10-CM | POA: Insufficient documentation

## 2021-05-18 DIAGNOSIS — M6281 Muscle weakness (generalized): Secondary | ICD-10-CM | POA: Insufficient documentation

## 2021-05-18 DIAGNOSIS — R262 Difficulty in walking, not elsewhere classified: Secondary | ICD-10-CM | POA: Diagnosis present

## 2021-05-18 DIAGNOSIS — O99891 Other specified diseases and conditions complicating pregnancy: Secondary | ICD-10-CM | POA: Diagnosis not present

## 2021-05-18 DIAGNOSIS — M545 Low back pain, unspecified: Secondary | ICD-10-CM | POA: Diagnosis present

## 2021-05-18 DIAGNOSIS — M62830 Muscle spasm of back: Secondary | ICD-10-CM | POA: Diagnosis present

## 2021-05-18 NOTE — Therapy (Signed)
?OUTPATIENT PHYSICAL THERAPY THORACOLUMBAR EVALUATION ? ? ?Patient Name: Bonnie Cooper ?MRN: [id number] ?DOB:July 07, 1995, 26 y.o., female ?Today's Date: 05/18/2021 ? ? PT End of Session - 05/18/21 0086   ? ? Visit Number 1   ? Authorization Type [employer]   ? Authorization Time Period $50 co-pay   ? PT Start Time 0802   ? PT Stop Time 0848   ? PT Time Calculation (min) 46 min   ? Activity Tolerance Patient tolerated treatment well   ? Behavior During Therapy [facility] for tasks assessed/performed   ? ?  ?  ? ?  ? ? ?Past Medical History:  ?Diagnosis Date  ? Anxiety   ? Chlamydia   ? Depression   ? just recently, no meds. or tx  ? Headache   ? Sickle cell trait (HCC)   ? Tooth pain 09/25/2018  ? ?Past Surgical History:  ?Procedure Laterality Date  ? HERNIA REPAIR    ? navel  2001  ? ?Patient Active Problem List  ? Diagnosis Date Noted  ? Gastroesophageal reflux during pregnancy in third trimester, antepartum 05/09/2021  ? Sickle cell trait (HCC) 02/28/2021  ? Chronic hypertension 01/17/2021  ? Supervision of high risk pregnancy, antepartum 12/30/2020  ? Hypothyroid in pregnancy, antepartum 02/18/2019  ? Major depressive disorder 11/19/2018  ? Migraine headache without aura 08/06/2018  ? ? ?PCP: Steffanie Rainwater, MD ? ?REFERRING PROVIDER: Milas Hock, MD ? ?REFERRING DIAG: Acute low back pain due to pregnancy ? ?THERAPY DIAG:  ?Acute midline low back pain without sciatica ? ?Muscle spasm of back ? ?Difficulty in walking, not elsewhere classified ? ?Muscle weakness (generalized) ? ?ONSET DATE: 05-11-21 ? ?SUBJECTIVE:                                                                                                                                                                                          ? ?SUBJECTIVE STATEMENT: ?Patient reports about 2 weeks of intense left sided low back pain at work.  She explains that this started with being at work and standing heavily on her left leg.  She states she began have left low back pain  which became shooting pain up into her left low back.  She is due in May and would like to be able to continue working up until her due date.  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  She has one child but did not have any issues with  back pain with her first.   ? ?PERTINENT HISTORY:  ?Has one child.  No history of low back issues.  Abdominal hernia repair in 2001.   ? ?PAIN:  ?Are you having pain? Yes ?NPRS scale: 0/10 (yesterday 10/10- limping and could not put weight on her left leg) ?Pain location: Left low back and up left side of her back ?Pain orientation: Left  ?PAIN TYPE: aching and sharp ?Pain description: constant  ?Aggravating factors: standing or reaching over a patient ?Relieving factors: meds and massage ? ?PRECAUTIONS: Other: pregnancy ? ?WEIGHT BEARING RESTRICTIONS No ? ?FALLS:  ?Has patient fallen in last 6 months? No, Number of falls: 0 ? ?LIVING ENVIRONMENT: ?Lives with: lives with their family and lives with their spouse ?Lives in: House/apartment ?Stairs: No; Internal: 0 steps; none and External: 0 steps; none ?Has following equipment at home: None ? 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 ?PLOF: Independent ? ?PATIENT GOALS To eliminate pain and be able to finish out my pregnancy working (due date in May) ? ? ?OBJECTIVE:  ? ?DIAGNOSTIC FINDINGS:  ?No diagnostic tests for low back due to pregnancy ? ?PATIENT SURVEYS:  ?FOTO 74 ? ?SCREENING FOR RED FLAGS: ?Bowel or bladder incontinence: No ?Spinal tumors: No ?Cauda equina syndrome: No ?Compression fracture: No ?Abdominal aneurysm: No ? ?COGNITION: ? Overall cognitive status: Within functional limits for tasks assessed   ?  ?SENSATION: ? Light touch: Appears intact ?  ? ?MUSCLE LENGTH: ?Hamstrings: Right 60 deg; Left 60 deg ?Thomas test: Right pos deg; Left pos deg ? ?POSTURE:   ?Increased lumbar lordosis ? ?PALPATION: ?Tender to palpation at left S.I. joint ? ?LUMBARAROM/PROM ? ?All planes of motions WFL ? ?LE AROM/PROM: ? ?WNL ? ?LE MMT: ? ?4+ to 5/5 throughout bilateral LE's ? ?LUMBAR SPECIAL TESTS:  ?Straight leg raise test: Negative ? ? ?GAIT: ?Distance walked: 100 ft ?Assistive device utilized: None ?Level of assistance: Complete Independence ?Comments: antalgic gait ? ? ? ?TODAY'S TREATMENT  ?Initial eval completed and Initiated HEP  ? ? ?PATIENT EDUCATION:  ?Education details: Initiated HEP and educated on anatomy of lumbar spine ?Person educated: Patient ?Education method: Explanation, Demonstration, Verbal cues, and Handouts ?Education comprehension: verbalized understanding, returned demonstration, and verbal cues required ? ? ?HOME EXERCISE PROGRAM: ?Access Code: [email or web address] ?URL: [email or web address] ?Date: 05/18/2021 ?Prepared by: Mikey Kirschner ? ?Exercises ?Supine Posterior Pelvic Tilt - 2 x daily - 7 x weekly - 2 sets - 10 reps ?Standing Hamstring Stretch on Chair - 2 x daily - 7 x weekly - 1 sets - 3 reps - 30 sec hold ?Quadriceps Stretch with Table - 2 x daily - 7 x weekly - 1 sets - 3 reps - 30 sec hold ? ? ?ASSESSMENT: ? ?CLINICAL IMPRESSION: ?Patient is a 26 y.o. pregnant female who was seen today for physical therapy evaluation and treatment for low back pain.  ? ? ?OBJECTIVE IMPAIRMENTS Abnormal gait, decreased knowledge of condition, difficulty walking, decreased strength, increased fascial restrictions, increased muscle spasms, impaired flexibility, and pain.  ? ?ACTIVITY LIMITATIONS cleaning, community activity, meal prep, occupation, laundry, yard work, and shopping.  ? ?PERSONAL FACTORS Age, Fitness, Profession, and 1 comorbidity: current pregnancy  are also affecting patient's functional outcome.  ? ? ?REHAB POTENTIAL: Good ? ?CLINICAL DECISION MAKING: Stable/uncomplicated ? ?EVALUATION COMPLEXITY: Low ? ? ?GOALS: ?Goals reviewed with patient?  Yes ? ?SHORT TERM GOALS: ? ?Independence with HEP ?Baseline: new program ?Target date: 06/15/2021 ?Goal status: INITIAL ? ?2.  Patient to report 50% reduction in symptoms ?  Baseline: na ?Target date: 06/15/2021 ?Goal status: INITIAL ? ?3.  Patient to be able to work full shift with pain no greater than 5/10 ?Baseline: 10/10 ?Target date: 06/15/2021 ?Goal status: INITIAL ? ? ?LONG TERM GOALS: ? ?Independence with advanced HEP ?Baseline: na ?Target date: 07/13/2021 ?Goal status: INITIAL ? ?2.  Patient to demonstrate normal non-antalgic heel to toe progression gait ?Baseline: antalgic ?Target date: 07/13/2021 ?Goal status: INITIAL ? ?3.  Patient to report consistent overall pain level of no more than 2/10 ?Baseline: 10/10 ?Target date: 07/13/2021 ?Goal status: INITIAL ? ?4.  Patient to be able to continue working up until due date.  ?Baseline: na ?Target date: 07/13/2021 ?Goal status: INITIAL ? ? ? ?PLAN: ?PT FREQUENCY: 1-2x/week ? ?PT DURATION: 8 weeks ? ?PLANNED INTERVENTIONS: Therapeutic exercises, Therapeutic activity, Neuromuscular re-education, Balance training, Gait training, Patient/Family education, Joint mobilization, Aquatic Therapy, Dry Needling, Cryotherapy, Moist heat, Taping, and Manual therapy ? ?PLAN FOR NEXT SESSION: Review HEP, NuStep, progress core stabilization considering active pregnancy ? ? ?Victorino DikeJennifer B. Randon Somera, PT ?03/08/239:11 AM  ?

## 2021-05-23 ENCOUNTER — Ambulatory Visit: Payer: Managed Care, Other (non HMO) | Admitting: *Deleted

## 2021-05-23 ENCOUNTER — Ambulatory Visit: Payer: Managed Care, Other (non HMO) | Attending: Obstetrics and Gynecology

## 2021-05-23 ENCOUNTER — Ambulatory Visit (INDEPENDENT_AMBULATORY_CARE_PROVIDER_SITE_OTHER): Payer: Managed Care, Other (non HMO) | Admitting: Obstetrics and Gynecology

## 2021-05-23 ENCOUNTER — Other Ambulatory Visit: Payer: Self-pay

## 2021-05-23 ENCOUNTER — Encounter: Payer: Self-pay | Admitting: *Deleted

## 2021-05-23 VITALS — BP 101/67 | HR 109

## 2021-05-23 VITALS — BP 112/80 | HR 99 | Wt 177.0 lb

## 2021-05-23 DIAGNOSIS — O10013 Pre-existing essential hypertension complicating pregnancy, third trimester: Secondary | ICD-10-CM | POA: Diagnosis not present

## 2021-05-23 DIAGNOSIS — E039 Hypothyroidism, unspecified: Secondary | ICD-10-CM

## 2021-05-23 DIAGNOSIS — O99280 Endocrine, nutritional and metabolic diseases complicating pregnancy, unspecified trimester: Secondary | ICD-10-CM

## 2021-05-23 DIAGNOSIS — Z148 Genetic carrier of other disease: Secondary | ICD-10-CM

## 2021-05-23 DIAGNOSIS — Z3A29 29 weeks gestation of pregnancy: Secondary | ICD-10-CM

## 2021-05-23 DIAGNOSIS — I10 Essential (primary) hypertension: Secondary | ICD-10-CM

## 2021-05-23 DIAGNOSIS — O285 Abnormal chromosomal and genetic finding on antenatal screening of mother: Secondary | ICD-10-CM | POA: Diagnosis not present

## 2021-05-23 DIAGNOSIS — O10919 Unspecified pre-existing hypertension complicating pregnancy, unspecified trimester: Secondary | ICD-10-CM | POA: Insufficient documentation

## 2021-05-23 DIAGNOSIS — O99283 Endocrine, nutritional and metabolic diseases complicating pregnancy, third trimester: Secondary | ICD-10-CM | POA: Diagnosis not present

## 2021-05-23 DIAGNOSIS — O0990 Supervision of high risk pregnancy, unspecified, unspecified trimester: Secondary | ICD-10-CM

## 2021-05-23 NOTE — Progress Notes (Signed)
? ?  PRENATAL VISIT NOTE ? ?Subjective:  ?Bonnie Cooper is a 26 y.o. G2P1001 at [redacted]w[redacted]d being seen today for ongoing prenatal care.  She is currently monitored for the following issues for this high-risk pregnancy and has Migraine headache without aura; Major depressive disorder; Hypothyroid in pregnancy, antepartum; Supervision of high risk pregnancy, antepartum; Chronic hypertension; Sickle cell trait ([place]); and Gastroesophageal reflux during pregnancy in third trimester, antepartum on their problem list. ? ?Patient doing well with no acute concerns today. She reports no complaints.  Contractions: Not present. Vag. Bleeding: None.  Movement: Present. Denies leaking of fluid. Pt states reflux has resolved with medication. ? ?The following portions of the patient's history were reviewed and updated as appropriate: allergies, current medications, past family history, past medical history, past social history, past surgical history and problem list. Problem list updated. ? ?Objective:  ? ?Vitals:  ? 05/23/21 1624  ?BP: 112/80  ?Pulse: 99  ?Weight: 177 lb (80.3 kg)  ? ? ?Fetal Status: Fetal Heart Rate (bpm): 147 Fundal Height: 28 cm Movement: Present    ? ?General:  Alert, oriented and cooperative. Patient is in no acute distress.  ?Skin: Skin is warm and dry. No rash noted.   ?Cardiovascular: Normal heart rate noted  ?Respiratory: Normal respiratory effort, no problems with respiration noted  ?Abdomen: Soft, gravid, appropriate for gestational age.  Pain/Pressure: Absent     ?Pelvic: Cervical exam deferred        ?Extremities: Normal range of motion.     ?Mental Status:  Normal mood and affect. Normal behavior. Normal judgment and thought content.  ? ?Assessment and Plan:  ?Pregnancy: G2P1001 at [redacted]w[redacted]d ? ?1. Supervision of high risk pregnancy, antepartum ?Continue routine care ? ?2. Chronic hypertension ?BP currently well controlled.  Not on any medication ? ?3. Hypothyroid in pregnancy, antepartum ?Recheck thyroid panel at  next visit ? ?4. [redacted] weeks gestation of pregnancy ? ? ?Preterm labor symptoms and general obstetric precautions including but not limited to vaginal bleeding, contractions, leaking of fluid and fetal movement were reviewed in detail with the patient. ? ?Please refer to After Visit Summary for other counseling recommendations.  ? ?Return in about 2 weeks (around 06/06/2021) for [facility], in person. ? ? ?Lynnda Shields, MD ?Faculty Attending ?[facility] ?  ?

## 2021-05-24 ENCOUNTER — Other Ambulatory Visit: Payer: Self-pay | Admitting: *Deleted

## 2021-05-24 DIAGNOSIS — O10919 Unspecified pre-existing hypertension complicating pregnancy, unspecified trimester: Secondary | ICD-10-CM

## 2021-05-24 DIAGNOSIS — E039 Hypothyroidism, unspecified: Secondary | ICD-10-CM

## 2021-05-26 ENCOUNTER — Ambulatory Visit: Payer: Managed Care, Other (non HMO)

## 2021-06-06 ENCOUNTER — Ambulatory Visit (INDEPENDENT_AMBULATORY_CARE_PROVIDER_SITE_OTHER): Payer: Managed Care, Other (non HMO) | Admitting: Family Medicine

## 2021-06-06 ENCOUNTER — Other Ambulatory Visit: Payer: Self-pay

## 2021-06-06 VITALS — BP 112/74 | HR 106 | Wt 180.9 lb

## 2021-06-06 DIAGNOSIS — I10 Essential (primary) hypertension: Secondary | ICD-10-CM

## 2021-06-06 DIAGNOSIS — O0990 Supervision of high risk pregnancy, unspecified, unspecified trimester: Secondary | ICD-10-CM

## 2021-06-06 DIAGNOSIS — K219 Gastro-esophageal reflux disease without esophagitis: Secondary | ICD-10-CM

## 2021-06-06 DIAGNOSIS — E039 Hypothyroidism, unspecified: Secondary | ICD-10-CM

## 2021-06-06 DIAGNOSIS — O99280 Endocrine, nutritional and metabolic diseases complicating pregnancy, unspecified trimester: Secondary | ICD-10-CM

## 2021-06-06 DIAGNOSIS — O99613 Diseases of the digestive system complicating pregnancy, third trimester: Secondary | ICD-10-CM

## 2021-06-06 MED ORDER — PANTOPRAZOLE SODIUM 20 MG PO TBEC
20.0000 mg | DELAYED_RELEASE_TABLET | Freq: Every day | ORAL | 2 refills | Status: DC
Start: 2021-06-06 — End: 2021-07-11

## 2021-06-06 NOTE — Patient Instructions (Signed)

## 2021-06-07 NOTE — Progress Notes (Signed)
? ?  PRENATAL VISIT NOTE ? ?Subjective:  ?Bonnie Cooper is a 26 y.o. G2P1001 at [redacted]w[redacted]d being seen today for ongoing prenatal care.  She is currently monitored for the following issues for this high-risk pregnancy and has Migraine headache without aura; Major depressive disorder; Hypothyroid in pregnancy, antepartum; Supervision of high risk pregnancy, antepartum; Chronic hypertension; Sickle cell trait ([place]); and Gastroesophageal reflux during pregnancy in third trimester, antepartum on their problem list. ? ?Patient reports heartburn.  Contractions: Not present. Vag. Bleeding: None.  Movement: Present. Denies leaking of fluid.  ? ?The following portions of the patient's history were reviewed and updated as appropriate: allergies, current medications, past family history, past medical history, past social history, past surgical history and problem list.  ? ?Objective:  ? ?Vitals:  ? 06/06/21 1524  ?BP: 112/74  ?Pulse: (!) 106  ?Weight: 180 lb 14.4 oz (82.1 kg)  ? ? ?Fetal Status: Fetal Heart Rate (bpm): 160 Fundal Height: 30 cm Movement: Present    ? ?General:  Alert, oriented and cooperative. Patient is in no acute distress.  ?Skin: Skin is warm and dry. No rash noted.   ?Cardiovascular: Normal heart rate noted  ?Respiratory: Normal respiratory effort, no problems with respiration noted  ?Abdomen: Soft, gravid, appropriate for gestational age.  Pain/Pressure: Present     ?Pelvic: Cervical exam deferred        ?Extremities: Normal range of motion.     ?Mental Status: Normal mood and affect. Normal behavior. Normal judgment and thought content.  ? ?Assessment and Plan:  ?Pregnancy: G2P1001 at [redacted]w[redacted]d ?1. Supervision of high risk pregnancy, antepartum ?Continue prenatal care.  ? ?2. Hypothyroid in pregnancy, antepartum ?Last TSH was normal ?Recheck next visit. ? ?3. Chronic hypertension ?BP is well controlled on no meds ?Continue ASA ? ?4. Gastroesophageal reflux during pregnancy in third trimester, antepartum ?GERD despite  H2 blocker--switch to PPI ?- pantoprazole (PROTONIX) 20 MG tablet; Take 1 tablet (20 mg total) by mouth daily.  Dispense: 90 tablet; Refill: 2 ? ?Preterm labor symptoms and general obstetric precautions including but not limited to vaginal bleeding, contractions, leaking of fluid and fetal movement were reviewed in detail with the patient. ?Please refer to After Visit Summary for other counseling recommendations.  ? ?Return in 2 weeks (on 06/20/2021) for [facility]. ? ?Future Appointments  ?Date Time Provider [place]  ?06/20/2021  3:15 PM [facility] NURSE [facility]  ?06/20/2021  3:30 PM [facility] US3 [facility] [place]  ?06/24/2021  9:55 AM Aletha Halim, MD [facility]  ? ? ?Donnamae Jude, MD ? ?

## 2021-06-13 ENCOUNTER — Encounter: Payer: Managed Care, Other (non HMO) | Admitting: Obstetrics and Gynecology

## 2021-06-20 ENCOUNTER — Ambulatory Visit: Payer: Managed Care, Other (non HMO) | Admitting: *Deleted

## 2021-06-20 ENCOUNTER — Encounter: Payer: Self-pay | Admitting: *Deleted

## 2021-06-20 ENCOUNTER — Ambulatory Visit: Payer: Managed Care, Other (non HMO) | Attending: Obstetrics

## 2021-06-20 VITALS — BP 106/68 | HR 88

## 2021-06-20 DIAGNOSIS — O0990 Supervision of high risk pregnancy, unspecified, unspecified trimester: Secondary | ICD-10-CM | POA: Diagnosis present

## 2021-06-20 DIAGNOSIS — O10013 Pre-existing essential hypertension complicating pregnancy, third trimester: Secondary | ICD-10-CM | POA: Diagnosis not present

## 2021-06-20 DIAGNOSIS — O99283 Endocrine, nutritional and metabolic diseases complicating pregnancy, third trimester: Secondary | ICD-10-CM | POA: Diagnosis not present

## 2021-06-20 DIAGNOSIS — O285 Abnormal chromosomal and genetic finding on antenatal screening of mother: Secondary | ICD-10-CM | POA: Diagnosis not present

## 2021-06-20 DIAGNOSIS — O10919 Unspecified pre-existing hypertension complicating pregnancy, unspecified trimester: Secondary | ICD-10-CM | POA: Insufficient documentation

## 2021-06-20 DIAGNOSIS — E039 Hypothyroidism, unspecified: Secondary | ICD-10-CM | POA: Insufficient documentation

## 2021-06-20 DIAGNOSIS — D573 Sickle-cell trait: Secondary | ICD-10-CM

## 2021-06-20 DIAGNOSIS — Z3A33 33 weeks gestation of pregnancy: Secondary | ICD-10-CM

## 2021-06-22 ENCOUNTER — Other Ambulatory Visit: Payer: Self-pay | Admitting: *Deleted

## 2021-06-22 DIAGNOSIS — E039 Hypothyroidism, unspecified: Secondary | ICD-10-CM

## 2021-06-22 DIAGNOSIS — O10913 Unspecified pre-existing hypertension complicating pregnancy, third trimester: Secondary | ICD-10-CM

## 2021-06-24 ENCOUNTER — Ambulatory Visit (INDEPENDENT_AMBULATORY_CARE_PROVIDER_SITE_OTHER): Payer: Managed Care, Other (non HMO) | Admitting: Obstetrics and Gynecology

## 2021-06-24 VITALS — BP 109/66 | HR 98 | Wt 182.4 lb

## 2021-06-24 DIAGNOSIS — I10 Essential (primary) hypertension: Secondary | ICD-10-CM

## 2021-06-24 DIAGNOSIS — Z3A34 34 weeks gestation of pregnancy: Secondary | ICD-10-CM

## 2021-06-24 DIAGNOSIS — E039 Hypothyroidism, unspecified: Secondary | ICD-10-CM

## 2021-06-24 DIAGNOSIS — O0990 Supervision of high risk pregnancy, unspecified, unspecified trimester: Secondary | ICD-10-CM

## 2021-06-24 DIAGNOSIS — D573 Sickle-cell trait: Secondary | ICD-10-CM

## 2021-06-24 DIAGNOSIS — O99280 Endocrine, nutritional and metabolic diseases complicating pregnancy, unspecified trimester: Secondary | ICD-10-CM

## 2021-06-24 NOTE — Progress Notes (Signed)
? ? ?  PRENATAL VISIT NOTE ? ?Subjective:  ?Bonnie Cooper is a 26 y.o. G2P1001 at [redacted]w[redacted]d being seen today for ongoing prenatal care.  She is currently monitored for the following issues for this high-risk pregnancy and has Migraine headache without aura; Major depressive disorder; Hypothyroid in pregnancy, antepartum; Supervision of high risk pregnancy, antepartum; Chronic hypertension; Sickle cell trait ([place]); and Gastroesophageal reflux during pregnancy in third trimester, antepartum on their problem list. ? ?Patient reports no complaints.  Contractions: Irritability. Vag. Bleeding: None.  Movement: Present. Denies leaking of fluid.  ? ?The following portions of the patient's history were reviewed and updated as appropriate: allergies, current medications, past family history, past medical history, past social history, past surgical history and problem list.  ? ?Objective:  ? ?Vitals:  ? 06/24/21 0957  ?BP: 109/66  ?Pulse: 98  ?Weight: 182 lb 6.4 oz (82.7 kg)  ? ? ?Fetal Status: Fetal Heart Rate (bpm): 146   Movement: Present    ? ?General:  Alert, oriented and cooperative. Patient is in no acute distress.  ?Skin: Skin is warm and dry. No rash noted.   ?Cardiovascular: Normal heart rate noted  ?Respiratory: Normal respiratory effort, no problems with respiration noted  ?Abdomen: Soft, gravid, appropriate for gestational age.  Pain/Pressure: Present     ?Pelvic: Cervical exam deferred        ?Extremities: Normal range of motion.  Edema: None  ?Mental Status: Normal mood and affect. Normal behavior. Normal judgment and thought content.  ? ?Assessment and Plan:  ?Pregnancy: G2P1001 at [redacted]w[redacted]d ?1. Hypothyroid in pregnancy, antepartum ?Last used synthroid two months ago and she states she was on it preg pregnancy. Recommend repeat labs today ?- TSH Rfx on Abnormal to Free T4 ? ?2. [redacted] weeks gestation of pregnancy ?Gbs nv ?D/w pt re: birth control nv ? ?3. Chronic hypertension ?Doing well on no meds; f/u repeat u/s on  5/8 ?4/10: 47%, ac 76%, afi 11.4 ? ?4. Supervision of high risk pregnancy, antepartum ? ?5. Sickle cell trait ([place]) ?Screening UCx nv ? ?Preterm labor symptoms and general obstetric precautions including but not limited to vaginal bleeding, contractions, leaking of fluid and fetal movement were reviewed in detail with the patient. ?Please refer to After Visit Summary for other counseling recommendations.  ? ?Return in about 2 weeks (around 07/08/2021) for in person, md or app, high risk ob. ? ?Future Appointments  ?Date Time Provider [place]  ?07/11/2021 10:35 AM Chancy Milroy, MD [facility]  ?07/18/2021  1:55 PM Donnamae Jude, MD [facility]  ?07/18/2021  3:15 PM [facility] NURSE [facility]  ?07/18/2021  3:30 PM [facility] US2 [facility] [place]  ?07/25/2021  9:15 AM Donnamae Jude, MD [facility]  ?08/01/2021  9:15 AM Griffin Basil, MD [facility]  ? ? ?Aletha Halim, MD ? ?

## 2021-06-25 LAB — TSH RFX ON ABNORMAL TO FREE T4: TSH: 1.98 u[IU]/mL (ref 0.450–4.500)

## 2021-07-07 ENCOUNTER — Encounter: Payer: Self-pay | Admitting: Obstetrics and Gynecology

## 2021-07-11 ENCOUNTER — Encounter: Payer: Self-pay | Admitting: General Practice

## 2021-07-11 ENCOUNTER — Encounter: Payer: Self-pay | Admitting: Obstetrics and Gynecology

## 2021-07-11 ENCOUNTER — Other Ambulatory Visit (HOSPITAL_COMMUNITY)
Admission: RE | Admit: 2021-07-11 | Discharge: 2021-07-11 | Disposition: A | Discharge status: Home / Self Care | Payer: Managed Care, Other (non HMO) | Source: Ambulatory Visit | Attending: Obstetrics and Gynecology | Admitting: Obstetrics and Gynecology

## 2021-07-11 ENCOUNTER — Ambulatory Visit (INDEPENDENT_AMBULATORY_CARE_PROVIDER_SITE_OTHER): Payer: Managed Care, Other (non HMO) | Admitting: Obstetrics and Gynecology

## 2021-07-11 VITALS — BP 112/77 | HR 93 | Wt 182.9 lb

## 2021-07-11 DIAGNOSIS — E039 Hypothyroidism, unspecified: Secondary | ICD-10-CM

## 2021-07-11 DIAGNOSIS — I10 Essential (primary) hypertension: Secondary | ICD-10-CM

## 2021-07-11 DIAGNOSIS — O99280 Endocrine, nutritional and metabolic diseases complicating pregnancy, unspecified trimester: Secondary | ICD-10-CM

## 2021-07-11 DIAGNOSIS — O0990 Supervision of high risk pregnancy, unspecified, unspecified trimester: Secondary | ICD-10-CM

## 2021-07-11 DIAGNOSIS — D573 Sickle-cell trait: Secondary | ICD-10-CM

## 2021-07-11 LAB — OB RESULTS CONSOLE GC/CHLAMYDIA: Gonorrhea: NEGATIVE

## 2021-07-11 NOTE — Patient Instructions (Signed)
Vaginal Delivery  Vaginal delivery means that you give birth by pushing your baby out of your birth canal (vagina). Your health care team will help you before, during, and after vaginal delivery. Birth experiences are unique for every woman and every pregnancy, and birth experiences vary depending on where you choose to give birth. What are the risks and benefits? Generally, this is safe. However, problems may occur, including: Bleeding. Infection. Damage to other structures such as vaginal tearing. Allergic reactions to medicines. Despite the risks, benefits of vaginal delivery include less risk of bleeding and infection and a shorter recovery time compared to a Cesarean delivery. Cesarean delivery, or C-section, is the surgical delivery of a baby. What happens when I arrive at the birth center or hospital? Once you are in labor and have been admitted into the hospital or birth center, your health care team may: Review your pregnancy history and any concerns that you have. Talk with you about your birth plan and discuss pain control options. Check your blood pressure, breathing, and heartbeat. Assess your baby's heartbeat. Monitor your uterus for contractions. Check whether your bag of water (amniotic sac) has broken (ruptured). Insert an IV into one of your veins. This may be used to give you fluids and medicines. Monitoring Your health care team may assess your contractions (uterine monitoring) and your baby's heart rate (fetal monitoring). You may need to be monitored: Often, but not continuously (intermittently). All the time or for long periods at a time (continuously). Continuous monitoring may be needed if: You are taking certain medicines, such as medicine to relieve pain or make your contractions stronger. You have pregnancy or labor complications. Monitoring may be done by: Placing a special stethoscope or a handheld monitoring device on your abdomen to check your baby's  heartbeat and to check for contractions. Placing monitors on your abdomen (external monitors) to record your baby's heartbeat and the frequency and length of contractions. Placing monitors inside your uterus through your vagina (internal monitors) to record your baby's heartbeat and the frequency, length, and strength of your contractions. Depending on the type of monitor, it may remain in your uterus or on your baby's head until birth. Telemetry. This is a type of continuous monitoring that can be done with external or internal monitors. Instead of having to stay in bed, you are able to move around. Physical exam Your health care team may perform frequent physical exams. This may include: Checking how and where your baby is positioned in your uterus. Checking your cervix to determine: Whether it is thinning out (effacing). Whether it is opening up (dilating). What happens during labor and delivery?  Normal labor and delivery is divided into the following three stages: Stage 1 This is the longest stage of labor. Throughout this stage, you will feel contractions. Contractions generally feel mild, infrequent, and irregular at first. They get stronger, more frequent, and more regular as you move through this stage. You may have contractions about every 2-3 minutes. This stage ends when your cervix is completely dilated to 4 inches (10 cm) and completely effaced. Stage 2 This stage starts once your cervix is completely effaced and dilated and lasts until the delivery of your baby. This is the stage where you will feel an urge to push your baby out of your vagina. You may feel stretching and burning pain, especially when the widest part of your baby's head passes through the vaginal opening (crowning). Once your baby is delivered, the umbilical cord will be   clamped and cut. Timing of cutting the cord will depend on your wishes, your baby's health, and your health care provider's practices. Your baby  will be placed on your bare chest (skin-to-skin contact) in an upright position and covered with a warm blanket. If you are choosing to breastfeed, watch your baby for feeding cues, like rooting or sucking, and help the baby to your breast for his or her first feeding. Stage 3 This stage starts immediately after the birth of your baby and ends after you deliver the placenta. This stage may take anywhere from 5 to 30 minutes. After your baby has been delivered, you will feel contractions as your body expels the placenta. These contractions also help your uterus get smaller and reduce bleeding. What can I expect after labor and delivery? After labor is over, you and your baby will be assessed closely until you are ready to go home. Your health care team will teach you how to care for yourself and your baby. You and your baby may be encouraged to stay in the same room (rooming in) during your hospital stay. This will help promote early bonding and successful breastfeeding. Your uterus will be checked and massaged regularly (fundal massage). You may continue to receive fluids and medicines through an IV. You will have some soreness and pain in your abdomen, vagina, and the area of skin between your vaginal opening and your anus (perineum). If an incision was made near your vagina (episiotomy) or if you had some vaginal tearing during delivery, cold compresses may be placed on your episiotomy or your tear. This helps to reduce pain and swelling. It is normal to have vaginal bleeding after delivery. Wear a sanitary pad for vaginal bleeding and discharge. Summary Vaginal delivery means that you will give birth by pushing your baby out of your birth canal (vagina). Your health care team will monitor you and your baby throughout the stages of labor. After you deliver your baby, your health care team will continue to assess you and your baby to ensure you are both recovering as expected after delivery. This  information is not intended to replace advice given to you by your health care provider. Make sure you discuss any questions you have with your health care provider. Document Revised: 01/26/2020 Document Reviewed: 01/26/2020 Elsevier Patient Education  2023 [employer]  

## 2021-07-11 NOTE — Progress Notes (Signed)
Subjective:  ?Bonnie Cooper is a 26 y.o. G2P1001 at [redacted]w[redacted]d being seen today for ongoing prenatal care.  She is currently monitored for the following issues for this high-risk pregnancy and has Migraine headache without aura; Major depressive disorder; Hypothyroid in pregnancy, antepartum; Supervision of high risk pregnancy, antepartum; Chronic hypertension; Sickle cell trait (HCC); and Gastroesophageal reflux during pregnancy in third trimester, antepartum on their problem list. ? ?Patient reports General discomforts of pregnancy. Denies HA or visual changes.   .  .  Movement: Present. Denies leaking of fluid.  ? ?The following portions of the patient's history were reviewed and updated as appropriate: allergies, current medications, past family history, past medical history, past social history, past surgical history and problem list. Problem list updated. ? ?Objective:  ? ?Vitals:  ? 07/11/21 1051  ?BP: 112/77  ?Pulse: 93  ?Weight: 182 lb 14.4 oz (83 kg)  ? ? ?Fetal Status: Fetal Heart Rate (bpm): 154   Movement: Present    ? ?General:  Alert, oriented and cooperative. Patient is in no acute distress.  ?Skin: Skin is warm and dry. No rash noted.   ?Cardiovascular: Normal heart rate noted  ?Respiratory: Normal respiratory effort, no problems with respiration noted  ?Abdomen: Soft, gravid, appropriate for gestational age.       ?Pelvic:  Cervical exam performed        ?Extremities: Normal range of motion.  Edema: None  ?Mental Status: Normal mood and affect. Normal behavior. Normal judgment and thought content.  ? ?Urinalysis:     ? ?Assessment and Plan:  ?Pregnancy: G2P1001 at [redacted]w[redacted]d ? ?1. Supervision of high risk pregnancy, antepartum ?Labor precautions ?- GC/Chlamydia probe amp ([place])not at [facility] ?- Culture, beta strep (group b only) ? ?2. Chronic hypertension ?Growth next week ?IOL scheduled for 39 weeks ? ?3. Sickle cell trait (HCC) ? ?- Culture, OB Urine ? ?4. Hypothyroid in pregnancy, antepartum ?Stable ?No  Meds ?Repeat thyroid function at [facility] visit ? ?Term labor symptoms and general obstetric precautions including but not limited to vaginal bleeding, contractions, leaking of fluid and fetal movement were reviewed in detail with the patient. ?Please refer to After Visit Summary for other counseling recommendations.  ?Return in about 1 week (around 07/18/2021) for OB visit, face to face, MD only. ? ? ?Hermina Staggers, MD ?

## 2021-07-12 LAB — GC/CHLAMYDIA PROBE AMP (~~LOC~~) NOT AT ARMC
Chlamydia: NEGATIVE
Comment: NEGATIVE
Comment: NORMAL
Neisseria Gonorrhea: NEGATIVE

## 2021-07-13 LAB — URINE CULTURE, OB REFLEX

## 2021-07-13 LAB — CULTURE, OB URINE

## 2021-07-14 ENCOUNTER — Encounter: Payer: Self-pay | Admitting: Student

## 2021-07-14 ENCOUNTER — Other Ambulatory Visit: Payer: Self-pay

## 2021-07-14 ENCOUNTER — Telehealth (HOSPITAL_COMMUNITY): Payer: Self-pay | Admitting: *Deleted

## 2021-07-14 ENCOUNTER — Encounter (HOSPITAL_COMMUNITY): Payer: Self-pay | Admitting: *Deleted

## 2021-07-14 DIAGNOSIS — O219 Vomiting of pregnancy, unspecified: Secondary | ICD-10-CM

## 2021-07-14 MED ORDER — PROMETHAZINE HCL 25 MG PO TABS: 25.0000 mg | ORAL_TABLET | Freq: Four times a day (QID) | ORAL | 0 refills | Status: DC | PRN

## 2021-07-14 NOTE — Telephone Encounter (Signed)
PLAN:

## 2021-07-15 ENCOUNTER — Encounter (HOSPITAL_COMMUNITY): Payer: Self-pay | Admitting: Obstetrics & Gynecology

## 2021-07-15 ENCOUNTER — Inpatient Hospital Stay (HOSPITAL_COMMUNITY)
Admission: AD | Admit: 2021-07-15 | Discharge: 2021-07-15 | Disposition: A | Discharge status: Home / Self Care | Payer: Managed Care, Other (non HMO) | Attending: Obstetrics & Gynecology | Admitting: Obstetrics & Gynecology

## 2021-07-15 DIAGNOSIS — O471 False labor at or after 37 completed weeks of gestation: Secondary | ICD-10-CM | POA: Insufficient documentation

## 2021-07-15 DIAGNOSIS — Z3689 Encounter for other specified antenatal screening: Secondary | ICD-10-CM

## 2021-07-15 DIAGNOSIS — O479 False labor, unspecified: Secondary | ICD-10-CM

## 2021-07-15 DIAGNOSIS — Z3A37 37 weeks gestation of pregnancy: Secondary | ICD-10-CM | POA: Insufficient documentation

## 2021-07-15 LAB — CULTURE, BETA STREP (GROUP B ONLY): Strep Gp B Culture: NEGATIVE

## 2021-07-15 NOTE — MAU Note (Signed)
I have communicated with Leticia Penna, DO and reviewed vital signs:  ?Vitals:  ? 07/15/21 1747  ?BP: 103/73  ?Pulse: 89  ?Resp: 16  ?Temp: 99.3 ?F (37.4 ?C)  ?SpO2: 100%  ?  ?Vaginal exam:  Exam by:: Georgina Snell, RN (RN attempted cervical exam; unable to reach cervix. Pt refused cervical exam from another nurse),  ? ?Also reviewed contraction pattern and that non-stress test is reactive.  It has been documented that patient is contracting every 3.5-7 minutes. Patient is talking and texting during contractions. Patient denies any other complaints.  Based on this report provider has given order for discharge.  A discharge order and diagnosis entered by a provider.   ?Labor discharge instructions reviewed with patient. Patient verbalized understanding on when to return to the hospital.  ? ? ? ? ? ?

## 2021-07-15 NOTE — MAU Note (Signed)
Bonnie Cooper is a 26 y.o. at [redacted]w[redacted]d here in MAU reporting: ever since Mon when the man checked her, was nauseated and couldnl't eat nothing from Tue to [place].  (Today was the first time she was able to eat without feeling sick) She started having period cramps, but they was a lot worse.  They stopped now, comes here and there.  Doesn't know if it was Deberah Pelton, labor or what.  No bleeding or leaking. Cx was soft, but closed on Monday.reports +FM. ?At times feels like she has to go #2, but she don't. Loose stools earlier today.  ? ?Onset of complaint: Monday ?Pain score: mild ?Vitals:  ? 07/15/21 1747  ?BP: 103/73  ?Pulse: 89  ?Resp: 16  ?Temp: 99.3 ?F (37.4 ?C)  ?SpO2: 100%  ?   ?FHT:150 ?Lab orders placed from triage:  none ?

## 2021-07-15 NOTE — MAU Provider Note (Signed)
MAU LABOR CHECK  ? ?S: Ms. Bonnie Cooper is a 26 y.o. G2P1001 at 109w2d  who presents to MAU today complaining of contractions irregularly since Monday. She denies vaginal bleeding. She denies LOF. She reports normal fetal movement.  She was fingertip in the office on 5/1. Reports pain score as mild.  ? ?O: BP 103/73 (BP Location: Right Arm)   Pulse 89   Temp 99.3 ?F (37.4 ?C) (Oral)   Resp 16   Ht 5\' 3"  (1.6 m)   Wt 83.5 kg   LMP 09/28/2020   SpO2 100%   BMI 32.59 kg/m?  ? ?Cervical exam:  ?Fingertip in the office on 5/1.  ?RN attempted cervical check but reports it was too posterior to feel. Patient declined a second check by a different provider/RN.  ? ?Fetal Monitoring: ?Baseline: 150 ?Variability: mod ?Accelerations: 15x15, several  ?Decelerations: None ?Contractions: q 3-8 minutes  ? ? ?A: ?SIUP at [redacted]w[redacted]d  ?False labor ?Reactive NST  ? ?P: ?Encouraged adequate hydration and rest.  ?Discharge home with labor precautions.  ?Follow up appt in the office on 5/8.  ? ?7/8, DO ?07/15/2021 6:43 PM  ?

## 2021-07-18 ENCOUNTER — Encounter: Payer: Self-pay | Admitting: Family Medicine

## 2021-07-18 ENCOUNTER — Ambulatory Visit (INDEPENDENT_AMBULATORY_CARE_PROVIDER_SITE_OTHER): Payer: Managed Care, Other (non HMO) | Admitting: Family Medicine

## 2021-07-18 ENCOUNTER — Ambulatory Visit: Payer: Managed Care, Other (non HMO) | Admitting: *Deleted

## 2021-07-18 ENCOUNTER — Ambulatory Visit: Payer: Managed Care, Other (non HMO) | Attending: Obstetrics

## 2021-07-18 ENCOUNTER — Encounter: Payer: Self-pay | Admitting: Radiology

## 2021-07-18 VITALS — BP 110/82 | HR 88 | Wt 183.7 lb

## 2021-07-18 VITALS — BP 106/77 | HR 87

## 2021-07-18 DIAGNOSIS — O99280 Endocrine, nutritional and metabolic diseases complicating pregnancy, unspecified trimester: Secondary | ICD-10-CM | POA: Insufficient documentation

## 2021-07-18 DIAGNOSIS — Z3A37 37 weeks gestation of pregnancy: Secondary | ICD-10-CM

## 2021-07-18 DIAGNOSIS — O10013 Pre-existing essential hypertension complicating pregnancy, third trimester: Secondary | ICD-10-CM

## 2021-07-18 DIAGNOSIS — O0990 Supervision of high risk pregnancy, unspecified, unspecified trimester: Secondary | ICD-10-CM | POA: Diagnosis present

## 2021-07-18 DIAGNOSIS — I10 Essential (primary) hypertension: Secondary | ICD-10-CM

## 2021-07-18 DIAGNOSIS — O99283 Endocrine, nutritional and metabolic diseases complicating pregnancy, third trimester: Secondary | ICD-10-CM | POA: Diagnosis not present

## 2021-07-18 DIAGNOSIS — E039 Hypothyroidism, unspecified: Secondary | ICD-10-CM | POA: Diagnosis not present

## 2021-07-18 DIAGNOSIS — O10913 Unspecified pre-existing hypertension complicating pregnancy, third trimester: Secondary | ICD-10-CM | POA: Diagnosis present

## 2021-07-18 LAB — POCT URINALYSIS DIP (DEVICE)
Bilirubin Urine: NEGATIVE
Glucose, UA: NEGATIVE mg/dL
Hgb urine dipstick: NEGATIVE
Ketones, ur: NEGATIVE mg/dL
Nitrite: NEGATIVE
Protein, ur: NEGATIVE mg/dL
Specific Gravity, Urine: 1.02 (ref 1.005–1.030)
Urobilinogen, UA: 1 mg/dL (ref 0.0–1.0)
pH: 5.5 (ref 5.0–8.0)

## 2021-07-18 NOTE — Progress Notes (Signed)
? ?  PRENATAL VISIT NOTE ? ?Subjective:  ?Bonnie Cooper is a 26 y.o. G2P1001 at [redacted]w[redacted]d being seen today for ongoing prenatal care.  She is currently monitored for the following issues for this high-risk pregnancy and has Migraine headache without aura; Major depressive disorder; Hypothyroid in pregnancy, antepartum; Supervision of high risk pregnancy, antepartum; Chronic hypertension; Sickle cell trait ([place]); and Gastroesophageal reflux during pregnancy in third trimester, antepartum on their problem list. ? ?Patient reports no complaints.  Contractions: Irritability. Vag. Bleeding: None.  Movement: Present. Denies leaking of fluid.  ? ?The following portions of the patient's history were reviewed and updated as appropriate: allergies, current medications, past family history, past medical history, past social history, past surgical history and problem list.  ? ?Objective:  ? ?Vitals:  ? 07/18/21 1423  ?BP: 110/82  ?Pulse: 88  ?Weight: 183 lb 11.2 oz (83.3 kg)  ? ?Fetal Status: Fetal Heart Rate (bpm): 146 Fundal Height: 37 cm Movement: Present ? ?General:  Alert, oriented and cooperative. Patient is in no acute distress.  ?Skin: Skin is warm and dry. No rash noted.   ?Cardiovascular: Normal heart rate noted.  ?Respiratory: Normal respiratory effort, no problems with respiration noted.  ?Abdomen: Soft, gravid, appropriate for gestational age.       ?Pelvic: Cervical exam deferred.  ?Extremities: Normal range of motion.  Edema: None.  ?Mental Status: Normal mood and affect. Normal behavior. Normal judgment and thought content.  ? ?Assessment and Plan:  ?Pregnancy: G2P1001 at [redacted]w[redacted]d ? ?1. Supervision of high risk pregnancy, antepartum ?2. [redacted] weeks gestation of pregnancy ?Progressing well. FH and FHT within normal limits. VSS. Follow up visit in 1 week. Has IOL scheduled for 07/27/21.  ? ?3. Chronic hypertension ?BP within normal limits. No medications. No symptoms of pre-E today.  ? ?4. Hypothyroid in pregnancy,  antepartum ?Stable. No medications currently. Last TSH within normal limits on 4/14. Plan for repeat TSH at next visit. ? ?Term labor symptoms and general obstetric precautions including but not limited to vaginal bleeding, contractions, leaking of fluid and fetal movement were reviewed in detail with the patient. ? ?Please refer to After Visit Summary for other counseling recommendations.  ? ?Return in about 1 week (around 07/25/2021) for follow up HR OB visit. ? ?Future Appointments  ?Date Time Provider [place]  ?07/25/2021  9:15 AM Donnamae Jude, MD [facility]  ?07/27/2021  7:15 AM [facility] SCHED ROOM [facility] None  ?08/01/2021  9:15 AM Griffin Basil, MD [facility]  ? ?Genia Del, MD ? ?

## 2021-07-22 ENCOUNTER — Encounter (HOSPITAL_COMMUNITY): Payer: Self-pay | Admitting: Obstetrics & Gynecology

## 2021-07-22 ENCOUNTER — Other Ambulatory Visit: Payer: Self-pay

## 2021-07-22 ENCOUNTER — Inpatient Hospital Stay (HOSPITAL_COMMUNITY)
Admission: AD | Admit: 2021-07-22 | Discharge: 2021-07-24 | DRG: 806 | Disposition: A | Discharge status: Home / Self Care | Payer: Managed Care, Other (non HMO) | Attending: Obstetrics and Gynecology | Admitting: Obstetrics and Gynecology

## 2021-07-22 ENCOUNTER — Inpatient Hospital Stay (HOSPITAL_COMMUNITY): Payer: Managed Care, Other (non HMO) | Admitting: Anesthesiology

## 2021-07-22 DIAGNOSIS — Z3A38 38 weeks gestation of pregnancy: Secondary | ICD-10-CM | POA: Diagnosis not present

## 2021-07-22 DIAGNOSIS — E039 Hypothyroidism, unspecified: Secondary | ICD-10-CM | POA: Diagnosis present

## 2021-07-22 DIAGNOSIS — D573 Sickle-cell trait: Secondary | ICD-10-CM | POA: Diagnosis present

## 2021-07-22 DIAGNOSIS — O10919 Unspecified pre-existing hypertension complicating pregnancy, unspecified trimester: Secondary | ICD-10-CM

## 2021-07-22 DIAGNOSIS — G43009 Migraine without aura, not intractable, without status migrainosus: Secondary | ICD-10-CM | POA: Diagnosis present

## 2021-07-22 DIAGNOSIS — O99354 Diseases of the nervous system complicating childbirth: Secondary | ICD-10-CM | POA: Diagnosis present

## 2021-07-22 DIAGNOSIS — O99284 Endocrine, nutritional and metabolic diseases complicating childbirth: Secondary | ICD-10-CM | POA: Diagnosis present

## 2021-07-22 DIAGNOSIS — O9962 Diseases of the digestive system complicating childbirth: Secondary | ICD-10-CM | POA: Diagnosis present

## 2021-07-22 DIAGNOSIS — K219 Gastro-esophageal reflux disease without esophagitis: Secondary | ICD-10-CM | POA: Diagnosis present

## 2021-07-22 DIAGNOSIS — O0990 Supervision of high risk pregnancy, unspecified, unspecified trimester: Principal | ICD-10-CM

## 2021-07-22 DIAGNOSIS — O9902 Anemia complicating childbirth: Secondary | ICD-10-CM | POA: Diagnosis present

## 2021-07-22 DIAGNOSIS — O1002 Pre-existing essential hypertension complicating childbirth: Secondary | ICD-10-CM | POA: Diagnosis present

## 2021-07-22 DIAGNOSIS — O26893 Other specified pregnancy related conditions, third trimester: Secondary | ICD-10-CM | POA: Diagnosis present

## 2021-07-22 HISTORY — DX: Unspecified pre-existing hypertension complicating pregnancy, unspecified trimester: O10.919

## 2021-07-22 LAB — CBC
HCT: 33.9 % — ABNORMAL LOW (ref 36.0–46.0)
Hemoglobin: 11.5 g/dL — ABNORMAL LOW (ref 12.0–15.0)
MCH: 33 pg (ref 26.0–34.0)
MCHC: 33.9 g/dL (ref 30.0–36.0)
MCV: 97.4 fL (ref 80.0–100.0)
Platelets: 233 10*3/uL (ref 150–400)
RBC: 3.48 MIL/uL — ABNORMAL LOW (ref 3.87–5.11)
RDW: 13.5 % (ref 11.5–15.5)
WBC: 6.9 10*3/uL (ref 4.0–10.5)
nRBC: 0 % (ref 0.0–0.2)

## 2021-07-22 LAB — COMPREHENSIVE METABOLIC PANEL
ALT: 12 U/L (ref 0–44)
AST: 19 U/L (ref 15–41)
Albumin: 3.3 g/dL — ABNORMAL LOW (ref 3.5–5.0)
Alkaline Phosphatase: 124 U/L (ref 38–126)
Anion gap: 7 (ref 5–15)
BUN: 7 mg/dL (ref 6–20)
CO2: 21 mmol/L — ABNORMAL LOW (ref 22–32)
Calcium: 9.2 mg/dL (ref 8.9–10.3)
Chloride: 107 mmol/L (ref 98–111)
Creatinine, Ser: 0.91 mg/dL (ref 0.44–1.00)
GFR, Estimated: >60 mL/min (ref >60)
Glucose, Bld: 82 mg/dL (ref 70–99)
Potassium: 3.8 mmol/L (ref 3.5–5.1)
Sodium: 135 mmol/L (ref 135–145)
Total Bilirubin: 0.7 mg/dL (ref 0.3–1.2)
Total Protein: 7 g/dL (ref 6.5–8.1)

## 2021-07-22 LAB — TYPE AND SCREEN
ABO/RH(D): O POS
Antibody Screen: NEGATIVE

## 2021-07-22 LAB — RPR: RPR Ser Ql: NONREACTIVE

## 2021-07-22 MED ORDER — FERROUS SULFATE 325 (65 FE) MG PO TABS
325.0000 mg | ORAL_TABLET | ORAL | Status: DC
  Administered 2021-07-22 – 2021-07-24 (×2): 325 mg via ORAL
  Filled 2021-07-22 (×2): qty 1

## 2021-07-22 MED ORDER — FENTANYL-BUPIVACAINE-NACL 0.5-0.125-0.9 MG/250ML-% EP SOLN
EPIDURAL | Status: AC
  Filled 2021-07-22: qty 250

## 2021-07-22 MED ORDER — METHYLERGONOVINE MALEATE 0.2 MG PO TABS: 0.2000 mg | ORAL_TABLET | ORAL | Status: DC | PRN

## 2021-07-22 MED ORDER — LACTATED RINGERS IV SOLN
500.0000 mL | Freq: Once | INTRAVENOUS | Status: AC
  Administered 2021-07-22: 500 mL via INTRAVENOUS

## 2021-07-22 MED ORDER — ONDANSETRON HCL 4 MG/2ML IJ SOLN
4.0000 mg | Freq: Four times a day (QID) | INTRAMUSCULAR | Status: DC | PRN
  Administered 2021-07-22: 4 mg via INTRAVENOUS
  Filled 2021-07-22: qty 2

## 2021-07-22 MED ORDER — FENTANYL CITRATE (PF) 100 MCG/2ML IJ SOLN: 50.0000 ug | INTRAMUSCULAR | Status: DC | PRN

## 2021-07-22 MED ORDER — DIBUCAINE (PERIANAL) 1 % EX OINT
1.0000 "application " | TOPICAL_OINTMENT | CUTANEOUS | Status: DC | PRN
  Administered 2021-07-23: 1 via RECTAL
  Filled 2021-07-22: qty 28

## 2021-07-22 MED ORDER — FLEET ENEMA 7-19 GM/118ML RE ENEM: 1.0000 | ENEMA | Freq: Every day | RECTAL | Status: DC | PRN

## 2021-07-22 MED ORDER — BUPIVACAINE HCL (PF) 0.25 % IJ SOLN
INTRAMUSCULAR | Status: DC | PRN
  Administered 2021-07-22 (×2): 3 mL via EPIDURAL

## 2021-07-22 MED ORDER — TERBUTALINE SULFATE 1 MG/ML IJ SOLN
INTRAMUSCULAR | Status: AC
  Filled 2021-07-22: qty 1

## 2021-07-22 MED ORDER — PRENATAL MULTIVITAMIN CH
1.0000 | ORAL_TABLET | Freq: Every day | ORAL | Status: DC
  Administered 2021-07-22 – 2021-07-24 (×3): 1 via ORAL
  Filled 2021-07-22 (×3): qty 1

## 2021-07-22 MED ORDER — SOD CITRATE-CITRIC ACID 500-334 MG/5ML PO SOLN: 30.0000 mL | ORAL | Status: DC | PRN

## 2021-07-22 MED ORDER — BENZOCAINE-MENTHOL 20-0.5 % EX AERO
1.0000 "application " | INHALATION_SPRAY | CUTANEOUS | Status: DC | PRN
  Administered 2021-07-22: 1 via TOPICAL
  Filled 2021-07-22: qty 56

## 2021-07-22 MED ORDER — DIPHENHYDRAMINE HCL 25 MG PO CAPS: 25.0000 mg | ORAL_CAPSULE | Freq: Four times a day (QID) | ORAL | Status: DC | PRN

## 2021-07-22 MED ORDER — SIMETHICONE 80 MG PO CHEW: 80.0000 mg | CHEWABLE_TABLET | ORAL | Status: DC | PRN

## 2021-07-22 MED ORDER — EPHEDRINE 5 MG/ML INJ
10.0000 mg | INTRAVENOUS | Status: DC | PRN
  Filled 2021-07-22: qty 2

## 2021-07-22 MED ORDER — ONDANSETRON HCL 4 MG/2ML IJ SOLN: 4.0000 mg | INTRAMUSCULAR | Status: DC | PRN

## 2021-07-22 MED ORDER — LACTATED RINGERS IV SOLN: 500.0000 mL | INTRAVENOUS | Status: DC | PRN

## 2021-07-22 MED ORDER — ACETAMINOPHEN 325 MG PO TABS
650.0000 mg | ORAL_TABLET | ORAL | Status: DC | PRN
Start: 2021-07-22 — End: 2021-07-24
  Administered 2021-07-23: 650 mg via ORAL
  Filled 2021-07-22: qty 2

## 2021-07-22 MED ORDER — WITCH HAZEL-GLYCERIN EX PADS
1.0000 "application " | MEDICATED_PAD | CUTANEOUS | Status: DC | PRN
  Administered 2021-07-23: 1 via TOPICAL

## 2021-07-22 MED ORDER — PHENYLEPHRINE 80 MCG/ML (10ML) SYRINGE FOR IV PUSH (FOR BLOOD PRESSURE SUPPORT)
80.0000 ug | PREFILLED_SYRINGE | INTRAVENOUS | Status: DC | PRN
  Administered 2021-07-22: 80 ug via INTRAVENOUS
  Filled 2021-07-22: qty 10

## 2021-07-22 MED ORDER — LACTATED RINGERS IV SOLN: INTRAVENOUS | Status: DC

## 2021-07-22 MED ORDER — OXYTOCIN-SODIUM CHLORIDE 30-0.9 UT/500ML-% IV SOLN
2.5000 [IU]/h | INTRAVENOUS | Status: DC
  Administered 2021-07-22: 2.5 [IU]/h via INTRAVENOUS
  Filled 2021-07-22: qty 500

## 2021-07-22 MED ORDER — LIDOCAINE HCL (PF) 1 % IJ SOLN
30.0000 mL | INTRAMUSCULAR | Status: DC | PRN
Start: 2021-07-22 — End: 2021-07-22
  Filled 2021-07-22: qty 30

## 2021-07-22 MED ORDER — DIPHENHYDRAMINE HCL 50 MG/ML IJ SOLN: 12.5000 mg | INTRAMUSCULAR | Status: DC | PRN

## 2021-07-22 MED ORDER — METHYLERGONOVINE MALEATE 0.2 MG/ML IJ SOLN: 0.2000 mg | INTRAMUSCULAR | Status: DC | PRN

## 2021-07-22 MED ORDER — TETANUS-DIPHTH-ACELL PERTUSSIS 5-2.5-18.5 LF-MCG/0.5 IM SUSY: 0.5000 mL | PREFILLED_SYRINGE | Freq: Once | INTRAMUSCULAR | Status: DC

## 2021-07-22 MED ORDER — PHENYLEPHRINE 80 MCG/ML (10ML) SYRINGE FOR IV PUSH (FOR BLOOD PRESSURE SUPPORT)
80.0000 ug | PREFILLED_SYRINGE | INTRAVENOUS | Status: DC | PRN
  Filled 2021-07-22 (×2): qty 10

## 2021-07-22 MED ORDER — ACETAMINOPHEN 325 MG PO TABS: 650.0000 mg | ORAL_TABLET | ORAL | Status: DC | PRN

## 2021-07-22 MED ORDER — OXYTOCIN BOLUS FROM INFUSION
333.0000 mL | Freq: Once | INTRAVENOUS | Status: AC
  Administered 2021-07-22: 333 mL via INTRAVENOUS

## 2021-07-22 MED ORDER — OXYCODONE-ACETAMINOPHEN 5-325 MG PO TABS: 1.0000 | ORAL_TABLET | ORAL | Status: DC | PRN

## 2021-07-22 MED ORDER — DOCUSATE SODIUM 100 MG PO CAPS
100.0000 mg | ORAL_CAPSULE | Freq: Two times a day (BID) | ORAL | Status: DC
  Administered 2021-07-23 – 2021-07-24 (×4): 100 mg via ORAL
  Filled 2021-07-22 (×4): qty 1

## 2021-07-22 MED ORDER — FUROSEMIDE 20 MG PO TABS
20.0000 mg | ORAL_TABLET | Freq: Every day | ORAL | Status: DC
  Administered 2021-07-22 – 2021-07-24 (×3): 20 mg via ORAL
  Filled 2021-07-22 (×3): qty 1

## 2021-07-22 MED ORDER — FENTANYL CITRATE (PF) 100 MCG/2ML IJ SOLN
50.0000 ug | INTRAMUSCULAR | Status: DC | PRN
  Administered 2021-07-22: 50 ug via INTRAVENOUS
  Filled 2021-07-22: qty 2

## 2021-07-22 MED ORDER — BISACODYL 10 MG RE SUPP: 10.0000 mg | Freq: Every day | RECTAL | Status: DC | PRN

## 2021-07-22 MED ORDER — ONDANSETRON HCL 4 MG PO TABS: 4.0000 mg | ORAL_TABLET | ORAL | Status: DC | PRN

## 2021-07-22 MED ORDER — IBUPROFEN 600 MG PO TABS
600.0000 mg | ORAL_TABLET | Freq: Four times a day (QID) | ORAL | Status: DC
  Administered 2021-07-22 – 2021-07-24 (×8): 600 mg via ORAL
  Filled 2021-07-22 (×10): qty 1

## 2021-07-22 MED ORDER — MEDROXYPROGESTERONE ACETATE 150 MG/ML IM SUSP: 150.0000 mg | INTRAMUSCULAR | Status: DC | PRN

## 2021-07-22 MED ORDER — FENTANYL-BUPIVACAINE-NACL 0.5-0.125-0.9 MG/250ML-% EP SOLN
12.0000 mL/h | EPIDURAL | Status: DC | PRN
  Administered 2021-07-22: 12 mL/h via EPIDURAL
  Filled 2021-07-22: qty 250

## 2021-07-22 MED ORDER — OXYCODONE-ACETAMINOPHEN 5-325 MG PO TABS: 2.0000 | ORAL_TABLET | ORAL | Status: DC | PRN

## 2021-07-22 MED ORDER — COCONUT OIL OIL
1.0000 "application " | TOPICAL_OIL | Status: DC | PRN
  Administered 2021-07-23: 1 via TOPICAL

## 2021-07-22 MED ORDER — LIDOCAINE-EPINEPHRINE (PF) 2 %-1:200000 IJ SOLN
INTRAMUSCULAR | Status: DC | PRN
Start: 2021-07-22 — End: 2021-07-22
  Administered 2021-07-22: 2 mL via EPIDURAL
  Administered 2021-07-22: 3 mL via EPIDURAL

## 2021-07-22 MED ORDER — MEASLES, MUMPS & RUBELLA VAC IJ SOLR: 0.5000 mL | Freq: Once | INTRAMUSCULAR | Status: DC

## 2021-07-22 NOTE — Anesthesia Preprocedure Evaluation (Signed)
Anesthesia Evaluation  ?Patient identified by MRN, date of birth, ID band ?Patient awake ? ? ? ?Reviewed: ?Allergy & Precautions, Patient's Chart, lab work & pertinent test results ? ?Airway ?Mallampati: II ? ?TM Distance: >3 FB ?Neck ROM: Full ? ? ? Dental ? ?(+) Teeth Intact, Dental Advisory Given ?  ?Pulmonary ?neg pulmonary ROS,  ?  ?breath sounds clear to auscultation ? ? ? ? ? ? Cardiovascular ?hypertension,  ?Rhythm:Regular  ?Chronic htn no meds ?  ?Neuro/Psych ? Headaches,   ? GI/Hepatic ?GERD  ,  ?Endo/Other  ?Hypothyroidism  ? Renal/GU ?  ? ?  ?Musculoskeletal ?negative musculoskeletal ROS ?(+)  ? Abdominal ?  ?Peds ? Hematology ? ?(+) Blood dyscrasia, anemia , Lab Results ?     Component                Value               Date                 ?     WBC                      6.9                 07/22/2021           ?     HGB                      11.5 (L)            07/22/2021           ?     HCT                      33.9 (L)            07/22/2021           ?     MCV                      97.4                07/22/2021           ?     PLT                      233                 07/22/2021           ?   ?Anesthesia Other Findings ? ? Reproductive/Obstetrics ?(+) Pregnancy ? ?  ? ? ? ? ? ? ? ? ? ? ? ? ? ?  ?  ? ? ? ? ? ? ? ? ?Anesthesia Physical ?Anesthesia Plan ? ?ASA: 2 ? ?Anesthesia Plan: Epidural  ? ?Post-op Pain Management:   ? ?Induction:  ? ?PONV Risk Score and Plan: 2 and Treatment may vary due to age or medical condition ? ?Airway Management Planned: Natural Airway ? ?Additional Equipment: None ? ?Intra-op Plan:  ? ?Post-operative Plan:  ? ?Informed Consent: I have reviewed the patients History and Physical, chart, labs and discussed the procedure including the risks, benefits and alternatives for the proposed anesthesia with the patient or authorized representative who has indicated his/her understanding and acceptance.  ? ? ? ? ? ?Plan Discussed with:  ? ?Anesthesia  Plan Comments:   ? ? ? ? ? ? ?  Anesthesia Quick Evaluation ? ?

## 2021-07-22 NOTE — Discharge Summary (Signed)
? ?  Postpartum Discharge Summary ? ?Date of Service updated ? ?   ?Patient Name: Bonnie Cooper ?DOB: 06/26/1995 ?MRN: [id number] ? ?Date of admission: 07/22/2021 ?Delivery date:07/22/2021  ?Delivering provider: Woodroe Mode  ?Date of discharge: 07/24/2021 ? ?Admitting diagnosis: Chronic hypertension affecting pregnancy [O10.919] ?Intrauterine pregnancy: [redacted]w[redacted]d    ?Secondary diagnosis:  Principal Problem: ?  Chronic hypertension affecting pregnancy ? ?Additional problems: [place] trait, hypothyroid    ?Discharge diagnosis: Term Pregnancy Delivered and CHTN                                              ?Post partum procedures: none ?Augmentation: AROM ?Complications: None ? ?Hospital course: Onset of Labor With Vaginal Delivery      ?26y.o. yo G2P1001 at 399w2das admitted in Active Labor on 07/22/2021. Patient had an uncomplicated labor course as follows:  ?Membrane Rupture Time/Date: 3:11 AM ,07/22/2021   ?Delivery Method:Vaginal, Vacuum (Extractor)  ?Episiotomy: None  ?Lacerations:  None  ? ?Patient had an uncomplicated postpartum course. Her blood pressures were normotensive.  She is ambulating, tolerating a regular diet, passing flatus, and urinating well. Patient is discharged home in stable condition on 07/24/21. ? ?Newborn Data: ?Birth date:07/[birth year]/2021  ?Birth time:4:04 AM  ?Gender:Female  ?Living status:Living  ?Apgars:5 ,9  ?Weight:2880 g  ? ?Magnesium Sulfate received: No ?BMZ received: No ?Rhophylac:N/A ?MMR:N/A ?T-DaP:Given prenatally ?Flu: N/A ?Transfusion:No ? ?Physical exam  ?Vitals:  ? 07/22/21 2103 07/23/21 0525 07/23/21 1545 07/23/21 2316  ?BP: 96/65 91/66 105/73 124/81  ?Pulse: 80 79 79 100  ?Resp: '19 18 18 18  ' ?Temp: 98.3 ?F (36.8 ?C) 98 ?F (36.7 ?C) (!) 97 ?F (36.1 ?C)   ?TempSrc: Oral Oral Axillary   ?SpO2: 98% 100% 100% 100%  ? ?General: alert, cooperative, and no distress ?Lochia: appropriate ?Uterine Fundus: firm ?Incision: N/A ?DVT Evaluation: No significant calf/ankle edema. ?Labs: ?Lab Results   ?Component Value Date  ? WBC 6.9 07/22/2021  ? HGB 11.5 (L) 07/22/2021  ? HCT 33.9 (L) 07/22/2021  ? MCV 97.4 07/22/2021  ? PLT 233 07/22/2021  ? ? ?  Latest Ref Rng & Units 07/22/2021  ?  1:41 AM  ?CMP  ?Glucose 70 - 99 mg/dL 82    ?BUN 6 - 20 mg/dL 7    ?Creatinine 0.44 - 1.00 mg/dL 0.91    ?Sodium 135 - 145 mmol/L 135    ?Potassium 3.5 - 5.1 mmol/L 3.8    ?Chloride 98 - 111 mmol/L 107    ?CO2 22 - 32 mmol/L 21    ?Calcium 8.9 - 10.3 mg/dL 9.2    ?Total Protein 6.5 - 8.1 g/dL 7.0    ?Total Bilirubin 0.3 - 1.2 mg/dL 0.7    ?Alkaline Phos 38 - 126 U/L 124    ?AST 15 - 41 U/L 19    ?ALT 0 - 44 U/L 12    ? ?Edinburgh Score: ?   ? View : No data to display.  ?  ?  ?  ? ? ? ?After visit meds:  ?Allergies as of 07/24/2021   ?No Known Allergies ?  ? ?  ?Medication List  ?  ? ?STOP taking these medications   ? ?Blood Pressure Monitoring Devi ?  ?famotidine 20 MG tablet ?Commonly known as: Pepcid ?  ? ?  ? ?TAKE these medications   ? ?acetaminophen 325  MG tablet ?Commonly known as: TYLENOL ?Take 650 mg by mouth every 6 (six) hours as needed. ?  ?ferrous sulfate 325 (65 FE) MG tablet ?Take 1 tablet (325 mg total) by mouth every other day. ?  ?furosemide 20 MG tablet ?Commonly known as: LASIX ?Take 1 tablet (20 mg total) by mouth daily for 5 days. ?  ?ibuprofen 600 MG tablet ?Commonly known as: ADVIL ?Take 1 tablet (600 mg total) by mouth every 6 (six) hours. ?  ?Prenatal 27-1 MG Tabs ?Take 1 tablet by mouth daily at 12 noon. ?  ? ?  ? ? ? ?Discharge home in stable condition ?Infant Feeding: Breast ?Infant Disposition:home with mother ?Discharge instruction: per After Visit Summary and Postpartum booklet. ?Activity: Advance as tolerated. Pelvic rest for 6 weeks.  ?Diet: routine diet and low salt diet ?Future Appointments: ?Future Appointments  ?Date Time Provider [place]  ?07/29/2021  9:00 AM [facility] NUR[facility]  ?08/22/2021 10:35 AM Aletha Halim, MD [facility]  ? ?Follow up Visit: ? Follow-up Information    ? ? [facility] Follow up in 1 week(s).   ?Specialty: Obstetrics and Gynecology ?Contact information: ?[address] ?[place] [id number] ?[phone] ? ?  ?  ? ?  ?  ? ?  ? ? ? ?Please schedule this patient for a In person postpartum visit in 4 weeks with the following provider: Any provider. ?Additional Postpartum F/U:BP check 1 week  ?Low risk pregnancy complicated by: HTN ?Delivery mode:  Vaginal, Vacuum (Extractor)  ?Anticipated Birth Control:   patch  - Discussed not doing patch immediately postpartum due to risk of VTE as well as impact on early milk supply. She would like to hold on this at this time but would like to discuss at [facility] visit. She does not want a progesterone only alternative in the meantime.  ? ?07/24/2021 ?Radene Gunning, MD ? ? ? ?

## 2021-07-22 NOTE — Anesthesia Procedure Notes (Signed)
Epidural ?Patient location during procedure: OB ?Start time: 07/22/2021 2:18 AM ?End time: 07/22/2021 2:36 AM ? ?Staffing ?Anesthesiologist: Oleta Mouse, MD ?Performed: anesthesiologist  ? ?Preanesthetic Checklist ?Completed: patient identified, IV checked, risks and benefits discussed, monitors and equipment checked, pre-op evaluation and timeout performed ? ?Epidural ?Patient position: sitting ?Prep: DuraPrep ?Patient monitoring: heart rate, continuous pulse ox and blood pressure ?Approach: midline ?Location: L3-L4 ?Injection technique: LOR saline ? ?Needle:  ?Needle type: Tuohy  ?Needle gauge: 17 G ?Needle length: 9 cm ?Needle insertion depth: 7 cm ?Catheter type: closed end flexible ?Catheter size: 19 Gauge ?Catheter at skin depth: 14 cm ?Test dose: negative and 2% lidocaine with Epi 1:200 K ? ?Assessment ?Events: blood not aspirated, injection not painful, no injection resistance, no paresthesia and negative IV test ? ? ? ?

## 2021-07-22 NOTE — Progress Notes (Signed)
Patient Vitals for the past 4 hrs: ? BP Temp Pulse Resp SpO2  ?07/22/21 0320 103/62 -- 85 -- --  ?07/22/21 0255 100/64 -- 81 -- --  ?07/22/21 0252 120/74 -- 81 -- 100 %  ?07/22/21 0240 119/72 -- 81 -- 100 %  ?07/22/21 0235 124/80 -- 81 -- 100 %  ?07/22/21 0225 136/79 -- 95 -- 100 %  ?07/22/21 0040 115/86 98.5 ?F (36.9 ?C) 100 (!) 22 100 %  ? ?Comfortable w/epidural.  FHR w/some prolonged bradycardia, variable decels and late decels. W/some rebound tachycardia. Variability moderate. Received phenylephrine, position changes, IVF bolus.  Dr. Debroah Loop called to room. Cx 8-9/0sta. AROM w/clear fluid, FSE applied.  FHR now 150-160, mild variables, moderate variability. Starting to feel rectal pressure. Anticipate continued fast progress, SVD soon.  Will place IUPC/amnioinfusion in the meantime if variables persist beyond mild.   ?

## 2021-07-22 NOTE — MAU Note (Signed)
.  Bonnie Cooper is a 26 y.o. at [redacted]w[redacted]d here in MAU reporting: ctx (10/10) that started an hour ago and woke her up out of her sleep. Reports small amount of blood after using the bathroom, denies use of pad. Denies LOF, reports good FM.  ? ?Onset of complaint: today ?Pain score: 10/10 ?Vitals:  ? 07/22/21 0040  ?BP: 115/86  ?Pulse: 100  ?Resp: (!) 22  ?Temp: 98.5 ?F (36.9 ?C)  ?SpO2: 100%  ?   ?FHT:145 ? ? ?

## 2021-07-22 NOTE — Anesthesia Postprocedure Evaluation (Signed)
Anesthesia Post Note ? ?Patient: Bonnie Cooper ? ?Procedure(s) Performed: AN AD HOC LABOR EPIDURAL ? ?  ? ?Patient location during evaluation: Mother Baby ?Anesthesia Type: Epidural ?Level of consciousness: awake ?Pain management: satisfactory to patient ?Vital Signs Assessment: post-procedure vital signs reviewed and stable ?Respiratory status: spontaneous breathing ?Cardiovascular status: stable ?Anesthetic complications: no ? ? ?No notable events documented. ? ?Last Vitals:  ?Vitals:  ? 07/22/21 0739 07/22/21 1704  ?BP: 119/89 100/67  ?Pulse: 83 80  ?Resp: 18 18  ?Temp: 36.4 ?C 36.8 ?C  ?SpO2: 100% 98%  ?  ?Last Pain:  ?Vitals:  ? 07/22/21 1704  ?TempSrc: Oral  ?PainSc: 0-No pain  ? ?Pain Goal:   ? ?  ?  ?  ?  ?  ?  ?  ? ?Hadli Vandemark ? ? ? ? ?

## 2021-07-22 NOTE — Lactation Note (Signed)
This note was copied from a baby's chart. ?Lactation Consultation Note ? ?Patient Name: Bonnie Cooper ?Today's Date: 07/22/2021 ?Reason for consult: Initial assessment ?Age:27 hours ? ?P2, Mother hand expressed good flow of colostrum. ?Mother states she hopes to breastfeed this baby for 6 mos. ?Mother latched baby in cradle hold with ease.  Noted intermittent swallows. ?Basics reviewed. ?Feed on demand with cues.  Goal 8-12+ times per day after first 24 hrs.  Place baby STS if not cueing.  ?Mom made aware of O/P services, breastfeeding support groups, community resources, and our phone # for post-discharge questions.  ? ? ?Maternal Data ?Has patient been taught Hand Expression?: Yes ?Does the patient have breastfeeding experience prior to this delivery?: Yes ?How long did the patient breastfeed?: 1.5 mos. ? ?Feeding ?Mother's Current Feeding Choice: Breast Milk ? ?LATCH Score ?Latch: Grasps breast easily, tongue down, lips flanged, rhythmical sucking. ? ?Audible Swallowing: A few with stimulation ? ?Type of Nipple: Everted at rest and after stimulation ? ?Comfort (Breast/Nipple): Soft / non-tender ? ?Hold (Positioning): Assistance needed to correctly position infant at breast and maintain latch. ? ?LATCH Score: 8 ? ? ?Lactation Tools Discussed/Used ?  ? ?Interventions ?Interventions: Breast feeding basics reviewed;Support pillows;Education;LC Services brochure ? ?Discharge ?Pump: Hands Free;Personal (two hands free pumps) ? ?Consult Status ?Consult Status: Follow-up ?Date: 07/23/21 ?Follow-up type: In-patient ? ? ? ?Dahlia Byes Boschen ?07/22/2021, 11:01 AM ? ? ? ?

## 2021-07-22 NOTE — MAU Note (Signed)
Called L&D 1st call about patient having bleeding and clots with labor check. 1st call came down to assess patient and advised to recheck her in an hour to see if she continues bleeding or has cervical change. ?

## 2021-07-22 NOTE — H&P (Incomplete)
OBSTETRIC ADMISSION HISTORY AND PHYSICAL ? ?Bonnie Cooper is a 26 y.o. female G2P1001 with IUP at [redacted]w[redacted]d by [place] presenting for CTXs and spontaneous onset of labor. She reports +FMs, No LOF, no blurry vision, headaches or peripheral edema, and RUQ pain. She does report vaginal bleeding with multiple "tiny" clots noticed after CTXs woke her from sleep She plans on breast feeding. She request the patch for birth control. ?She received her prenatal care at  [facility]   ? ?Dating: By [place] --->  Estimated Date of Delivery: 08/03/21 ? ?Sono:   ? ?@[redacted]w[redacted]d , CWD, normal anatomy, cephalic presentation, 2814g, EFW ? ? ?Prenatal History/Complications: cHTN, hypothyroidism, [place] trait carrier ? ?Past Medical History: ?Past Medical History:  ?Diagnosis Date  ? Anxiety   ? Chlamydia   ? Depression   ? just recently, no meds. or tx  ? Headache   ? Hypertension   ? Hypothyroidism   ? Sickle cell trait (HCC)   ? Tooth pain 09/25/2018  ? ? ?Past Surgical History: ?Past Surgical History:  ?Procedure Laterality Date  ? HERNIA REPAIR    ? navel  2001  ? ? ?Obstetrical History: ?OB History   ? ? Gravida  ?2  ? Para  ?1  ? Term  ?1  ? Preterm  ?0  ? AB  ?0  ? Living  ?1  ?  ? ? SAB  ?0  ? IAB  ?0  ? Ectopic  ?0  ? Multiple  ?0  ? Live Births  ?1  ?   ?  ?  ? ? ?Social History ?Social History  ? ?Socioeconomic History  ? Marital status: Single  ?  Spouse name: Not on file  ? Number of children: Not on file  ? Years of education: Not on file  ? Highest education level: Not on file  ?Occupational History  ? Not on file  ?Tobacco Use  ? Smoking status: Never  ? Smokeless tobacco: Never  ?Vaping Use  ? Vaping Use: Never used  ?Substance and Sexual Activity  ? Alcohol use: Not Currently  ?  Comment: SOCIALLY   ? Drug use: Not Currently  ?  Types: Marijuana  ?  Comment: last 12/27/20 per pt "2-3 hits" pt states it is for nausea  ? Sexual activity: Yes  ?  Birth control/protection: None  ?  Comment: Last week  ?Other Topics Concern  ? Not on file  ?Social  History Narrative  ? Not on file  ? ?Social Determinants of Health  ? ?Financial Resource Strain: Not on file  ?Food Insecurity: No Food Insecurity  ? Worried About 12/29/20 in the Last Year: Never true  ? Ran Out of Food in the Last Year: Never true  ?Transportation Needs: No Transportation Needs  ? Lack of Transportation (Medical): No  ? Lack of Transportation (Non-Medical): No  ?Physical Activity: Not on file  ?Stress: Not on file  ?Social Connections: Not on file  ? ? ?Family History: ?Family History  ?Problem Relation Age of Onset  ? Diabetes Other   ? ? ?Allergies: ?No Known Allergies ? ?Medications Prior to Admission  ?Medication Sig Dispense Refill Last Dose  ? famotidine (PEPCID) 20 MG tablet Take 1 tablet (20 mg total) by mouth daily. 30 tablet 1 07/22/2021  ? Prenatal 27-1 MG TABS Take 1 tablet by mouth daily at 12 noon. 30 tablet 11 07/22/2021  ? acetaminophen (TYLENOL) 325 MG tablet Take 650 mg by mouth every 6 (six)  hours as needed.     ? Blood Pressure Monitoring DEVI 1 each by Does not apply route once a week. 1 each 0   ? ? ? ?Review of Systems  ? ?All systems reviewed and negative except as stated in HPI ? ?Blood pressure 115/86, pulse 100, temperature 98.5 ?F (36.9 ?C), resp. rate (!) 22, last menstrual period 09/28/2020, SpO2 100 %. ?General appearance: alert, cooperative, appears stated age, and mild distress ?Lungs: clear to auscultation bilaterally ?Heart: regular rate and rhythm ?Abdomen: soft, non-tender; bowel sounds normal ?Pelvic: WNL ?Extremities: Homans sign is negative, no sign of DVT ?Presentation: cephalic ?Fetal monitoringBaseline: *** bpm, Variability: Good {> 6 bpm), Accelerations: Reactive, and Decelerations: {FHR DECEL PRESENT:31526} ?Uterine activityFrequency: Every 2-3.5 minutes ?Dilation: 4 ?Effacement (%): 70, 80 ?Station: -2 ?Exam by:: Ginnie Smart RN ? ? ?Prenatal labs: ?ABO, Rh: O/Positive/-- (11/07 1056) ?Antibody: Negative (11/07 1056) ?Rubella: 17.60 (11/07  1056) ?RPR: Non Reactive (02/27 0848)  ?HBsAg: Negative (11/07 1056)  ?HIV: Non Reactive (02/27 0848)  ?GBS: Negative/-- (05/01 1104)  ?1 hr Glucola 119 ?Genetic screening  [place] trait ?Anatomy US normal ? ? ?Nursing Staff Provider  ?Office Location  [facility] Dating    ?Language  English Anatomy US   02/08/21, normal  ?Flu Vaccine   Declined 03/15/21 Genetic/Carrier Screen  NIPS:   Low Risk ?AFP:    ?Horizon: Neg. 3/4 - sickle cell trait  ?TDaP Vaccine   given 05/09/2021 Hgb A1C or  ?GTT Early  ?Third trimester : normal  ?COVID Vaccine J&j-1 dose   LAB RESULTS   ?Rhogam  n/a Blood Type O/Positive/-- (11/07 1056)   ?Baby Feeding Plan Breast Antibody Negative (11/07 1056)  ?Contraception Patch Rubella 17.60 (11/07 1056)  ?Circumcision Yes RPR Non Reactive (02/27 0848)   ?Pediatrician  [facility] HBsAg Negative (11/07 1056)   ?Support Person Daniel(FOB) HCVAb   ?Prenatal Classes  HIV Non Reactive (02/27 0848)     ?BTL Consent NA GBS   ?negative  ?VBAC Consent NA Pap  nml 08/22  ?     ?DME Rx Arly.Keller ] BP cuff ?[ ]  Weight Scale Waterbirth  [ ]  Class [ ]  Consent [ ]  CNM visit  ?PHQ9 & GAD7 [x]  new OB ?[x]  28 weeks  ?[x]  36 weeks Induction  [ ]  Orders Entered [ ] Foley Y/N  ? ? ?Prenatal Transfer Tool  ?Maternal Diabetes: No ?Genetic Screening: Abnormal:  Results: Other:[place] trait ?Maternal Ultrasounds/Referrals: Normal ?Fetal Ultrasounds or other Referrals:  None ?Maternal Substance Abuse:  No ?Significant Maternal Medications:  Meds include: Syntroid ?Significant Maternal Lab Results: Group B Strep negative ? ?No results found for this or any previous visit (from the past 24 hour(s)). ? ?Patient Active Problem List  ? Diagnosis Date Noted  ? Chronic hypertension affecting pregnancy 07/22/2021  ? Gastroesophageal reflux during pregnancy in third trimester, antepartum 05/09/2021  ? Sickle cell trait (HCC) 02/28/2021  ? Chronic hypertension 01/17/2021  ? Supervision of high risk pregnancy, antepartum 12/30/2020  ? Hypothyroid  in pregnancy, antepartum 02/18/2019  ? Major depressive disorder 11/19/2018  ? Migraine headache without aura 08/06/2018  ? ? ?Assessment/Plan:  ?Ivyrose Hashman is a 26 y.o. G2P1001 at [redacted]w[redacted]d here for spontaneous onset of labor and vaginal bleeding.  ? ?#Labor:expectant ?#Pain: *** ?#FWB: Cat 1 ?#ID:  N/a ?#MOF: breast ?#MOC:patch ?#Circ:  yes ? ?Cobain Morici S Lorenzo Pereyra, Student-MidWife  ?07/22/2021, 1:41 AM ? ?  ?

## 2021-07-23 NOTE — Progress Notes (Signed)
POSTPARTUM PROGRESS NOTE ? ?Subjective: Bonnie Cooper is a 26 y.o. A8T4196 s/p VAVD at [redacted]w[redacted]d.  She reports she doing well. No acute events overnight. She denies any problems with ambulating, voiding or po intake. Denies nausea or vomiting. She has  passed flatus and had BM. Pain is well controlled.  Lochia is MILD. ? ?Objective: ?Blood pressure 91/66, pulse 79, temperature 98 ?F (36.7 ?C), temperature source Oral, resp. rate 18, last menstrual period 09/28/2020, SpO2 100 %, unknown if currently breastfeeding. ? ?Physical Exam:  ?General: alert, cooperative and no distress ?Chest: no respiratory distress ?Abdomen: soft, non-tender  ?Uterine Fundus: firm and at level of umbilicus ?Extremities: No calf swelling or tenderness  nO edema ? ?Recent Labs  ?  07/22/21 ?0141  ?HGB 11.5*  ?HCT 33.9*  ? ? ?Assessment/Plan: ?Joeanne Robicheaux is a 26 y.o. Q2W9798 s/p VAVD at [redacted]w[redacted]d for SOL. ? ?Routine Postpartum Care: Doing well, pain well-controlled.  ?-- Continue routine care, lactation support  ?-- Contraception: Patch ?-- Feeding: Breast ? ?Dispo: Plan for discharge tomorrow. ? ?Jerre Simon, MD ?Faculty Practice, Center for [facility] ?07/23/2021 8:51 AM  ?

## 2021-07-24 MED ORDER — IBUPROFEN 600 MG PO TABS: 600.0000 mg | ORAL_TABLET | Freq: Four times a day (QID) | ORAL | 0 refills | Status: DC

## 2021-07-24 MED ORDER — FUROSEMIDE 20 MG PO TABS: 20.0000 mg | ORAL_TABLET | Freq: Every day | ORAL | 0 refills | Status: DC

## 2021-07-24 MED ORDER — FERROUS SULFATE 325 (65 FE) MG PO TABS: 325.0000 mg | ORAL_TABLET | ORAL | 0 refills | Status: DC

## 2021-07-24 NOTE — Lactation Note (Signed)
This note was copied from a baby's chart. ?Lactation Consultation Note ? ?Patient Name: Bonnie Cooper ?Today's Date: 07/24/2021 ?Reason for consult: Follow-up assessment;Difficult latch;Early term 37-38.6wks ?Age:26 years ? ?LC in to room prior to discharge. Baby is sleeping upon arrival. Mother reports soreness and engorgement. Mother is mildly engorged and reports a lot of pain with latch as well as when using the manual pump. Nipples are intact. Tried different flange sizes but did not help. FOB brought personal DEBP. Noted transitional breast milk with hand expression and collected ~2-mL. Spoon and fingerfed baby.  ? ?Observed heart-shaped tongue that does not seem to extend pass the gum. Mother explains she had a similar experience with first child who is now 26 y.o. Encouraged to talk to provider about this and provided list of resources for lip and tie restrictions.  ? ?Reinforced deep latch, chin tug and positioning with support pillows. Baby breastfed for 21-minutes, flanged lips, deep tugs and audible swallows.  ?Encouraged mother to used EBM to nipples, air-dry and use comfort gels for healing purposes. Discussed local resources to support her breastfeeding goals such as WIC and [facility] Lactation Services warm line.  ? ?Plan: ?1-Feeding on demand or 8-12 times in 24h period. ?2-Pump or hand-express and offer first EBM. ?3-EBM to nipples, air-dry and use comfort gels/coconut oil  ?4-Encouraged maternal rest, hydration and food intake.  ? ?Contact LC as needed for feeds/support.  ? ?Maternal Data ?Has patient been taught Hand Expression?: Yes ?Does the patient have breastfeeding experience prior to this delivery?: Yes ?How long did the patient breastfeed?: 1 month with first child ? ?Feeding ?Mother's Current Feeding Choice: Breast Milk ? ?LATCH Score ?Latch: Grasps breast easily, tongue down, lips flanged, rhythmical sucking. ? ?Audible Swallowing: Spontaneous and intermittent ? ?Type of Nipple: Everted at rest  and after stimulation ? ?Comfort (Breast/Nipple): Filling, red/small blisters or bruises, mild/mod discomfort ? ?Hold (Positioning): Assistance needed to correctly position infant at breast and maintain latch. ? ?LATCH Score: 8 ? ? ?Lactation Tools Discussed/Used ?Tools: Pump;Flanges ?Flange Size: 24;27 ?Breast pump type: Manual ?Pump Education: Milk Storage;Setup, frequency, and cleaning ?Reason for Pumping: stimulation and supplementation ?Pumping frequency: as needed ?Pumped volume: 2 mL (with demonstration) ? ?Interventions ?Interventions: Breast feeding basics reviewed;Assisted with latch;Skin to skin;Breast massage;Hand express;Breast compression;Hand pump;Coconut oil;Expressed milk;Position options;Support pillows;Adjust position;Ice;Education ? ?Discharge ?Discharge Education: Engorgement and breast care;Warning signs for feeding baby ?Pump: Personal;Manual ?WIC Program: No (plans to apply) ? ?Consult Status ?Consult Status: Complete ?Date: 07/24/21 ?Follow-up type: Call as needed ? ? ? ?Milanya Sunderland A Higuera Ancidey ?07/24/2021, 8:59 AM ? ? ? ?

## 2021-07-24 NOTE — Progress Notes (Signed)
CSW received consult for hx of marijuana use.  Referral was screened out due to the following: ?~MOB had no documented substance use after initial prenatal visit/+UPT. ?~MOB had no positive drug screens after initial prenatal visit/+UPT. ?~Baby's UDS is negative. ? ?Please consult CSW if current concerns arise or by MOB's request. ? ?CSW will monitor CDS results and make report to Child Protective Services if warranted. ? ?MOB was referred for history of depression/anxiety. ?* Referral screened out by Clinical Social Worker because none of the following criteria appear to apply: ?~ History of anxiety/depression during this pregnancy, or of post-partum depression following prior delivery. ?~ Diagnosis of anxiety and/or depression within last 3 years. No concerns noted in OB records.  ?OR ?* MOB's symptoms currently being treated with medication and/or therapy. ? ?Please contact the Clinical Social Worker if needs arise, by MOB request, or if MOB scores greater than 9/yes to question 10 on Edinburgh Postpartum Depression Screen.  ? ?Dawson Hollman Boyd-Gilyard, MSW, LCSW ?Clinical Social Work ?[phone] ? ?

## 2021-07-25 ENCOUNTER — Encounter: Payer: Managed Care, Other (non HMO) | Admitting: Family Medicine

## 2021-07-25 LAB — SURGICAL PATHOLOGY

## 2021-07-26 ENCOUNTER — Encounter: Payer: Self-pay | Admitting: Lactation Services

## 2021-07-26 NOTE — Progress Notes (Signed)
Called in [facility] due to bruised excoriated nipples to [facility].  ?

## 2021-07-27 ENCOUNTER — Inpatient Hospital Stay (HOSPITAL_COMMUNITY): Payer: Managed Care, Other (non HMO)

## 2021-07-27 ENCOUNTER — Inpatient Hospital Stay (HOSPITAL_COMMUNITY)
Admission: AD | Admit: 2021-07-27 | Payer: Managed Care, Other (non HMO) | Source: Home / Self Care | Admitting: Obstetrics and Gynecology

## 2021-07-28 ENCOUNTER — Ambulatory Visit (INDEPENDENT_AMBULATORY_CARE_PROVIDER_SITE_OTHER): Payer: Managed Care, Other (non HMO) | Admitting: Lactation Services

## 2021-07-28 ENCOUNTER — Encounter: Payer: Self-pay | Admitting: Lactation Services

## 2021-07-28 ENCOUNTER — Other Ambulatory Visit: Payer: Self-pay | Admitting: Lactation Services

## 2021-07-28 DIAGNOSIS — Z0130 Encounter for examination of blood pressure without abnormal findings: Secondary | ICD-10-CM

## 2021-07-28 DIAGNOSIS — O9122 Nonpurulent mastitis associated with the puerperium: Secondary | ICD-10-CM

## 2021-07-28 MED ORDER — CEPHALEXIN 500 MG PO CAPS: 500.0000 mg | ORAL_CAPSULE | Freq: Three times a day (TID) | ORAL | 0 refills | Status: DC

## 2021-07-28 NOTE — Patient Instructions (Addendum)
Ice to left breast for 10-15 minutes prior to pumping every 3 hours No massage to breast No heat to breast Ibuprofen 600 mg every 6 hours Keflex 500 mg 3 times a day for 7 days Call if not improving in 2-3 days Follow up with Lactation next week

## 2021-07-28 NOTE — Progress Notes (Signed)
Keflex ordered for s/s Mastitis per Dr. Alvester Morin.

## 2021-07-28 NOTE — Progress Notes (Signed)
Patient presented to office asking to be seen by Lactation.   Patient with redness and swelling to inside of left breast. Dimpling noted to the skin and area very tender to touch. This has not improved since seen by Lactation. She has developed a fever today of 101 F. She is not feeling well. Dr. Alvester Morin in to assess, she prescribed Keflex for Mastitis.   Mom scheduled for for BP check tomorrow in the office, completed today while in the office. She reports swelling to feed and ankles have decreased. She denies HA, blurred vision or dizziness. Initial BP 136/91, repeat 126/85. Patient is in pain today.   Reviewed treatment for Mastitis. Reviewed completing ATB as prescribed. Patient to follow up with Lactation next week.

## 2021-07-29 ENCOUNTER — Ambulatory Visit: Payer: Managed Care, Other (non HMO)

## 2021-08-01 ENCOUNTER — Encounter: Payer: Managed Care, Other (non HMO) | Admitting: Obstetrics and Gynecology

## 2021-08-01 NOTE — H&P (Signed)
Bonnie Cooper is a 26 y.o. female G2P1001 with IUP at 35w2dby [place] and spontaneous onset of labor. She reports +FMs, No LOF, no blurry vision, headaches or peripheral edema, and RUQ pain. She does report vaginal bleeding with multiple "tiny" clots noticed after CTXs woke her from sleep She plans on breast feeding. She request the patch for birth control. [place]  Estimated Date of Delivery: 08/03/21   Sono:     _0 , CWD, normal anatomy, cephalic presentation, 27672C 18% EFW     Prenatal History/Complications: cHTN, hypothyroidism, [place] trait carrier   Past Medical History:     Past Medical History:  Diagnosis Date   Anxiety     Chlamydia     Depression      just recently, no meds. or tx   Headache     Hypertension     Hypothyroidism     [place]ick[place] cell trait [place]     Tooth pain 09/25/2018      Past Surgical History:      Past Surgical History:  Procedure Laterality Date   HERNIA REPAIR       navel   2001      Obstetrical History: OB History       Gravida  2   Para  1   Term  1   Preterm  0   AB  0   Living  1        SAB  0   IAB  0   Ectopic  0   Multiple  0   Live Births  1               Social History Social History         Socioeconomic History   Marital status: Single      Spouse name: Not on file   Number of children: Not on file   Years of education: Not on file   Highest education level: Not on file  Occupational History   Not on file  Tobacco Use   Smoking status: Never   Smokeless tobacco: Never  Vaping Use   Vaping Use: Never used  Substance and Sexual Activity   Alcohol use: Not Currently      Comment: SOCIALLY    Drug use: Not Currently      Types: Marijuana      Comment: last 12/27/20 per pt "2-3 hits" pt states it is for nausea   Sexual activity: Yes      Birth control/protection: None      Comment: Last week  Other Topics Concern   Not on  file  Social History Narrative   Not on file    Social Determinants of Health       Financial Resource Strain: Not on file  Food Insecurity: No Food Insecurity   Worried About Running Out of Food in the Last Year: Never true   Ran Out of Food in the Last Year: Never true  Transportation Needs: No Transportation Needs   Lack of Transportation (Medical): No   Lack of Transportation (Non-Medical): No  Physical Activity: Not on file  Stress: Not on file  Social Connections: Not on file      Family History:      Family History  Problem Relation Age of Onset   Diabetes Other        Allergies: No Known Allergies  Medications Prior to Admission  Medication Sig Dispense Refill Last Dose   famotidine (PEPCID) 20 MG tablet Take 1 tablet (20 mg total) by mouth daily. 30 tablet 1 07/22/2021   Prenatal 27-1 MG TABS Take 1 tablet by mouth daily at 12 noon. 30 tablet 11 07/22/2021   acetaminophen (TYLENOL) 325 MG tablet Take 650 mg by mouth every 6 (six) hours as needed.         Blood Pressure Monitoring DEVI 1 each by Does not apply route once a week. 1 each 0          Review of Systems    All systems reviewed and negative except as stated in HPI   Blood pressure 115/86, pulse 100, temperature 98.5 F (36.9 C), resp. rate (!) 22, last menstrual period 09/28/2020, SpO2 100 %. General appearance: alert, cooperative, appears stated age, and mild distress Lungs: clear to auscultation bilaterally Heart: regular rate and rhythm Abdomen: soft, non-tender; bowel sounds normal Pelvic: WNL Extremities: Homans sign is negative, no sign of DVT Presentation: cephalic Fetal monitoringBaseline: 145 bpm, Variability: Good {> 6 bpm), Accelerations: Reactive, and Decelerations: Absent Uterine activityFrequency: Every 2-3.5 minutes Dilation: 4 Effacement (%): 70, 80 Station: -2 Exam by:: Wilhemena Durie RN     Prenatal labs: ABO, Rh: O/Positive/-- (11/07 1056) Antibody: Negative  (11/07 1056) Rubella: 17.60 (11/07 1056) RPR: Non Reactive (02/27 0848)  HBsAg: Negative (11/07 1056)  HIV: Non Reactive (02/27 0848)  GBS: Negative/-- (05/01 1104)  1 hr Glucola 119 Genetic screening  [place] trait Anatomy US normal          Nursing Staff Provider  Office Location  [facility] Dating     Language  English Anatomy US   02/08/21, normal  Flu Vaccine   Declined 03/15/21 Genetic/Carrier Screen  NIPS:   Low Risk AFP:    Horizon: Neg. 3/4 - sickle cell trait  TDaP Vaccine   given 05/09/2021 Hgb A1C or  GTT Early  Third trimester : normal  COVID Vaccine J&j-1 dose   LAB RESULTS   Rhogam  n/a Blood Type O/Positive/-- (11/07 1056)   Baby Feeding Plan Breast Antibody Negative (11/07 1056)  Contraception Patch Rubella 17.60 (11/07 1056)  Circumcision Yes RPR Non Reactive (02/27 0848)   [place] HBsAg Negative (11/07 1056)   Support Person Daniel(FOB) HCVAb    Prenatal Classes   HIV Non Reactive (02/27 0848)     BTL Consent NA GBS   negative  VBAC Consent NA Pap  nml 08/22           DME Rx Valu.Nieves ] BP cuff _0  Weight Scale Waterbirth  _1  Class _2  Consent _3  CNM visit  PHQ9 & GAD7 _4  new OB _5  28 weeks  _6  36 weeks Induction  _7  Orders Entered _8 Foley Y/N      Prenatal Transfer Tool  Maternal Diabetes: No Genetic Screening: Abnormal:  Results: Other:[place] trait Maternal Ultrasounds/Referrals: Normal Fetal Ultrasounds or other Referrals:  None Maternal Substance Abuse:  No Significant Maternal Medications:  Meds include: Syntroid Significant Maternal Lab Results: Group B Strep negative   No results found for this or any previous visit (from the past 24 hour(s)).       Patient Active Problem List    Diagnosis Date Noted   Chronic hypertension affecting pregnancy 07/22/2021   Gastroesophageal reflux during pregnancy in third trimester, antepartum 05/09/2021   Sickle cell trait ([place]) 02/28/2021   Chronic hypertension 01/17/2021  Supervision  of high risk pregnancy, antepartum 12/30/2020   Hypothyroid in pregnancy, antepartum 02/18/2019   Major depressive disorder 11/19/2018   Migraine headache without aura 08/06/2018      Assessment/Plan:  Bonnie Cooper is a 26 y.o. G2P1001 at 79w2dhere for spontaneous onset of labor and vaginal bleeding.    #Labor:expectant #Pain:per request #FWB: Cat 1 #ID:      N/a #MOF: breast #MOC:patch #Circ:   yes  I personally saw and evaluated the patient, performing the key elements of the service. I developed and verified the management plan that is described in the resident's/student's note, and I agree with the content with my edits above. VSS, HRR&R, Resp unlabored, Legs neg.  FNigel Berthold CNM 08/01/2021 9:49 AM

## 2021-08-02 ENCOUNTER — Other Ambulatory Visit: Payer: Self-pay | Admitting: Lactation Services

## 2021-08-02 MED ORDER — DICLOXACILLIN SODIUM 500 MG PO CAPS: 500.0000 mg | ORAL_CAPSULE | Freq: Four times a day (QID) | ORAL | 0 refills | Status: DC

## 2021-08-02 NOTE — Progress Notes (Signed)
Spoke with Dr. Crissie Reese about Mastitis moving to the left breast after being on ATB for 5 days. Changed ATB to Dicloxicillin for 10 days.

## 2021-08-05 NOTE — Addendum Note (Signed)
Addended by: Geanie Berlin on: 08/05/2021 05:17 PM   Modules accepted: Level of Service

## 2021-08-05 NOTE — Progress Notes (Addendum)
   BREASTFEEDING PROBLEM  VISIT ENCOUNTER NOTE  Subjective:   Bonnie Cooper is a 27 y.o. G68P2002 female here for lactation visit with concern for mastitis.  Reports redness/warmth and pain in left breast. Wedge of redness on the breast. Also developed fever.   Health Maintenance Due  Topic Date Due   COVID-19 Vaccine (2 - [place] risk series) 07/31/2019    The following portions of the patient's history were reviewed and updated as appropriate: allergies, current medications, past family history, past medical history, past social history, past surgical history and problem list.  Review of Systems Pertinent items are noted in HPI.   Objective:  BP 126/85 (BP Location: Left Arm, Patient Position: Sitting, Cuff Size: Normal)   Pulse 94   Wt 173 lb 8 oz (78.7 kg)   BMI 30.73 kg/m  Gen: well appearing, NAD HEENT: no scleral icterus CV: RR Lung: Normal WOB Ext: warm well perfused  Breast: symmetric, everted nipples bilaterally. Left breast with wedge of redness and dimpling of the skin. TTP over this area.     Assessment and Plan:  1. Mastitis associated with childbirth, delivered Keflex TID  Return in 3-4 day if not improving Continue to feed and pump  2. BP check WNL   Please refer to After Visit Summary for other counseling recommendations.   No follow-ups on file. Future Appointments  Date Time Provider Department Center  08/22/2021 10:35 AM [place] Bing, MD [facility]     Federico Flake, MD, MPH, ABFM Attending Physician Faculty Practice- Center for [facility]

## 2021-08-08 ENCOUNTER — Encounter: Payer: Self-pay | Admitting: Radiology

## 2021-08-22 ENCOUNTER — Ambulatory Visit (INDEPENDENT_AMBULATORY_CARE_PROVIDER_SITE_OTHER): Payer: Managed Care, Other (non HMO) | Admitting: Obstetrics and Gynecology

## 2021-08-22 VITALS — BP 141/100 | HR 94 | Wt 167.2 lb

## 2021-08-22 DIAGNOSIS — I10 Essential (primary) hypertension: Secondary | ICD-10-CM | POA: Diagnosis not present

## 2021-08-22 DIAGNOSIS — O99280 Endocrine, nutritional and metabolic diseases complicating pregnancy, unspecified trimester: Secondary | ICD-10-CM | POA: Diagnosis not present

## 2021-08-22 DIAGNOSIS — O165 Unspecified maternal hypertension, complicating the puerperium: Secondary | ICD-10-CM | POA: Diagnosis not present

## 2021-08-22 DIAGNOSIS — E039 Hypothyroidism, unspecified: Secondary | ICD-10-CM

## 2021-08-22 MED ORDER — DOCUSATE SODIUM 100 MG PO CAPS: 100.0000 mg | ORAL_CAPSULE | Freq: Two times a day (BID) | ORAL | 2 refills | Status: DC | PRN

## 2021-08-22 MED ORDER — AMLODIPINE BESYLATE 5 MG PO TABS: 5.0000 mg | ORAL_TABLET | Freq: Every day | ORAL | 3 refills | Status: DC

## 2021-08-22 MED ORDER — SLYND 4 MG PO TABS
1.0000 | ORAL_TABLET | Freq: Every day | ORAL | 3 refills | Status: DC
Start: 2021-08-22 — End: 2021-08-22

## 2021-08-22 MED ORDER — SLYND 4 MG PO TABS: 1.0000 | ORAL_TABLET | Freq: Every day | ORAL | 3 refills | Status: DC

## 2021-08-22 NOTE — Patient Instructions (Signed)
Call your primary care provider for a check up in 4-6 weeks

## 2021-08-22 NOTE — Progress Notes (Signed)
    Post Partum Visit Note  Bonnie Cooper is a 26 y.o. [id number] s/p 5/12 VAVD/intact perineum at 38wks after presenting in active labor.  Anesthesia: epidural. Postpartum course has been going well except for an episode of mastitis in mid may that resolved with abx.  Baby is doing well per mother. Baby is feeding by both breast and bottle - Gerber Gentle . Bleeding no bleeding. Bowel function is abnormal: patient states she feels constipated  but states she took a laxative this morning. Bladder function is normal. Patient is not sexually active. Contraception method is none. Patient would like to use the patch for birth control. Postpartum depression screening: negative.    Edinburgh Postnatal Depression Scale - 08/22/21 1021       Edinburgh Postnatal Depression Scale:  In the Past 7 Days   I have been able to laugh and see the funny side of things. 1    I have looked forward with enjoyment to things. 0    I have blamed myself unnecessarily when things went wrong. 0    I have been anxious or worried for no good reason. 0    I have felt scared or panicky for no good reason. 0    Things have been getting on top of me. 1    I have been so unhappy that I have had difficulty sleeping. 1    I have felt sad or miserable. 1    I have been so unhappy that I have been crying. 1    The thought of harming myself has occurred to me. 0    Edinburgh Postnatal Depression Scale Total 5             Review of Systems A comprehensive review of systems was negative.  Objective:  BP (!) 141/100   Pulse 94   Wt 167 lb 3.2 oz (75.8 kg)   Breastfeeding Yes   BMI 29.62 kg/m    NAD  Assessment:   Patient stable  Plan:  *PP: Routine care. She would like to do the patch. She has used the nexplanon and depo provera in the past but did not like them. I told her that based on her BPs I don't recommend any estrogen containing options. She doesn't want to try an IUD (copper or hormone) and would like to  try the pill. Slynd sent in but I told her it may not be covered and if not, can do a generic POPs. Importance for taking it qday at the same time every day with 3hr window d/w pt.  pap neg 2022>>pt told to repeat in two years *Chronic HTN: on nothing during the pregnancy. No pre-eclampsia s/s. Will get a cbc, cmp, tsh and pt amenable to starting on norvasc. Pt to contact her pcp for regular post pregnancy follow up *Hypothyroidism: on no meds during pregnancy. Check TFTs today  RTC: 10d for BP check    Bing, MD [facility] [employer], [facility]

## 2021-08-22 NOTE — Addendum Note (Signed)
Addended by: Sandy Bing on: 08/22/2021 11:36 AM   Modules accepted: Orders

## 2021-08-23 ENCOUNTER — Other Ambulatory Visit: Payer: Self-pay | Admitting: Lactation Services

## 2021-08-23 LAB — COMPREHENSIVE METABOLIC PANEL
ALT: 14 IU/L (ref 0–32)
AST: 16 IU/L (ref 0–40)
Albumin/Globulin Ratio: 1.7 (ref 1.2–2.2)
Albumin: 4.2 g/dL (ref 3.9–5.0)
Alkaline Phosphatase: 100 IU/L (ref 44–121)
BUN/Creatinine Ratio: 9 (ref 9–23)
BUN: 8 mg/dL (ref 6–20)
Bilirubin Total: 0.4 mg/dL (ref 0.0–1.2)
CO2: 20 mmol/L (ref 20–29)
Calcium: 8.9 mg/dL (ref 8.7–10.2)
Chloride: 108 mmol/L — ABNORMAL HIGH (ref 96–106)
Creatinine, Ser: 0.93 mg/dL (ref 0.57–1.00)
Globulin, Total: 2.5 g/dL (ref 1.5–4.5)
Glucose: 70 mg/dL (ref 70–99)
Potassium: 3.8 mmol/L (ref 3.5–5.2)
Sodium: 143 mmol/L (ref 134–144)
Total Protein: 6.7 g/dL (ref 6.0–8.5)
eGFR: 86 mL/min/{1.73_m2} (ref >59)

## 2021-08-23 LAB — TSH RFX ON ABNORMAL TO FREE T4: TSH: 0.083 u[IU]/mL — ABNORMAL LOW (ref 0.450–4.500)

## 2021-08-23 LAB — CBC
Hematocrit: 35.2 % (ref 34.0–46.6)
Hemoglobin: 12 g/dL (ref 11.1–15.9)
MCH: 32.1 pg (ref 26.6–33.0)
MCHC: 34.1 g/dL (ref 31.5–35.7)
MCV: 94 fL (ref 79–97)
Platelets: 196 10*3/uL (ref 150–450)
RBC: 3.74 x10E6/uL — ABNORMAL LOW (ref 3.77–5.28)
RDW: 11.8 % (ref 11.7–15.4)
WBC: 3.7 10*3/uL (ref 3.4–10.8)

## 2021-08-23 LAB — T4F: T4,Free (Direct): 1.15 ng/dL (ref 0.82–1.77)

## 2021-08-23 MED ORDER — FLUCONAZOLE 100 MG PO TABS: ORAL_TABLET | ORAL | 0 refills | Status: DC

## 2021-08-23 NOTE — Progress Notes (Signed)
Diflucan ordered per standing order for burning pain in right breast post Mastitis and 2 courses of ATB. Will reassess patient next week for Lactation appointment.

## 2021-08-25 ENCOUNTER — Encounter: Payer: Self-pay | Admitting: Obstetrics and Gynecology

## 2021-08-31 ENCOUNTER — Ambulatory Visit (INDEPENDENT_AMBULATORY_CARE_PROVIDER_SITE_OTHER): Payer: Managed Care, Other (non HMO) | Admitting: Lactation Services

## 2021-08-31 VITALS — BP 117/86 | HR 78 | Ht 63.0 in | Wt 165.3 lb

## 2021-08-31 DIAGNOSIS — O165 Unspecified maternal hypertension, complicating the puerperium: Secondary | ICD-10-CM

## 2021-09-01 ENCOUNTER — Ambulatory Visit: Payer: Managed Care, Other (non HMO)

## 2021-09-01 ENCOUNTER — Ambulatory Visit: Payer: Managed Care, Other (non HMO) | Admitting: Lactation Services

## 2021-09-29 ENCOUNTER — Encounter: Payer: Self-pay | Admitting: Obstetrics and Gynecology

## 2021-11-04 ENCOUNTER — Encounter: Payer: Self-pay | Admitting: Obstetrics and Gynecology

## 2021-11-04 ENCOUNTER — Other Ambulatory Visit: Payer: Self-pay | Admitting: *Deleted

## 2021-11-04 MED ORDER — FLUCONAZOLE 150 MG PO TABS: 150.0000 mg | ORAL_TABLET | Freq: Once | ORAL | 0 refills | Status: AC

## 2022-08-06 ENCOUNTER — Encounter: Payer: Self-pay | Admitting: *Deleted

## 2022-10-05 ENCOUNTER — Other Ambulatory Visit: Payer: Self-pay | Admitting: Obstetrics and Gynecology

## 2022-10-10 ENCOUNTER — Other Ambulatory Visit: Payer: Self-pay

## 2022-10-10 ENCOUNTER — Encounter: Payer: Self-pay | Admitting: Obstetrics and Gynecology

## 2022-10-10 ENCOUNTER — Other Ambulatory Visit: Payer: Self-pay | Admitting: Obstetrics and Gynecology

## 2022-10-10 MED ORDER — SLYND 4 MG PO TABS: 1.0000 | ORAL_TABLET | Freq: Every day | ORAL | 1 refills | Status: DC

## 2022-10-10 NOTE — Telephone Encounter (Signed)
LOV  12/2020.- I talked to pt who stated she's unsure about staying her at [facility]; stated she will call back.

## 2022-12-11 ENCOUNTER — Other Ambulatory Visit: Payer: Self-pay

## 2022-12-11 ENCOUNTER — Ambulatory Visit (INDEPENDENT_AMBULATORY_CARE_PROVIDER_SITE_OTHER): Payer: Managed Care, Other (non HMO)

## 2022-12-11 VITALS — BP 133/106 | HR 77 | Ht 63.0 in | Wt 179.9 lb

## 2022-12-11 DIAGNOSIS — R3989 Other symptoms and signs involving the genitourinary system: Secondary | ICD-10-CM

## 2022-12-11 NOTE — Progress Notes (Signed)
Bonnie Cooper presents to office today reporting irritation with urination for past 6 days; patient also noticed that her urine was cloudy at home. UA reveals trace blood (patient started cycle today) and urobilirubin. Will send urine for culture and prescribed nothing per protocol as symptom criteria not met. Patient informed prescribed antibiotic may need to be changed based off culture results. Discussed urine culture results take 3-4 days to come back & she will be contacted if any changes are necessary. Patient requested that we send in more Rx'd ibuprofen 800mg ; informed patient I would have to check with doctor to see if I can send it in, and if so, I would let her know via MyChart message.  Patient verbalized understanding to all & had no questions at this time.    Meryl Crutch, RN 12/11/2022 3:23 PM

## 2022-12-12 LAB — POCT URINALYSIS DIP (DEVICE)
Bilirubin Urine: NEGATIVE
Glucose, UA: NEGATIVE mg/dL
Ketones, ur: NEGATIVE mg/dL
Leukocytes,Ua: NEGATIVE
Nitrite: NEGATIVE
Protein, ur: NEGATIVE mg/dL
Specific Gravity, Urine: 1.015 (ref 1.005–1.030)
Urobilinogen, UA: 2 mg/dL — ABNORMAL HIGH (ref 0.0–1.0)
pH: 8.5 — ABNORMAL HIGH (ref 5.0–8.0)

## 2022-12-12 LAB — URINE CULTURE

## 2022-12-13 ENCOUNTER — Other Ambulatory Visit: Payer: Self-pay | Admitting: Obstetrics and Gynecology

## 2022-12-14 ENCOUNTER — Encounter: Payer: Self-pay | Admitting: Obstetrics and Gynecology

## 2022-12-14 DIAGNOSIS — G43009 Migraine without aura, not intractable, without status migrainosus: Secondary | ICD-10-CM

## 2022-12-14 MED ORDER — IBUPROFEN 600 MG PO TABS: 600.0000 mg | ORAL_TABLET | Freq: Four times a day (QID) | ORAL | 0 refills | Status: DC

## 2022-12-18 MED ORDER — IBUPROFEN 600 MG PO TABS: 600.0000 mg | ORAL_TABLET | Freq: Four times a day (QID) | ORAL | 0 refills | Status: AC

## 2023-01-15 ENCOUNTER — Encounter: Payer: Self-pay | Admitting: Obstetrics and Gynecology

## 2023-01-15 ENCOUNTER — Other Ambulatory Visit: Payer: Self-pay

## 2023-01-15 ENCOUNTER — Other Ambulatory Visit (HOSPITAL_COMMUNITY)
Admission: RE | Admit: 2023-01-15 | Discharge: 2023-01-15 | Disposition: A | Discharge status: Home / Self Care | Payer: Managed Care, Other (non HMO) | Source: Ambulatory Visit | Attending: Obstetrics and Gynecology | Admitting: Obstetrics and Gynecology

## 2023-01-15 ENCOUNTER — Ambulatory Visit (INDEPENDENT_AMBULATORY_CARE_PROVIDER_SITE_OTHER): Payer: Managed Care, Other (non HMO) | Admitting: Obstetrics and Gynecology

## 2023-01-15 VITALS — BP 140/99 | HR 75 | Ht 63.0 in | Wt 176.8 lb

## 2023-01-15 DIAGNOSIS — Z113 Encounter for screening for infections with a predominantly sexual mode of transmission: Secondary | ICD-10-CM

## 2023-01-15 DIAGNOSIS — Z01419 Encounter for gynecological examination (general) (routine) without abnormal findings: Secondary | ICD-10-CM

## 2023-01-15 DIAGNOSIS — N760 Acute vaginitis: Secondary | ICD-10-CM | POA: Diagnosis not present

## 2023-01-15 DIAGNOSIS — Z3041 Encounter for surveillance of contraceptive pills: Secondary | ICD-10-CM

## 2023-01-15 DIAGNOSIS — B9689 Other specified bacterial agents as the cause of diseases classified elsewhere: Secondary | ICD-10-CM

## 2023-01-15 DIAGNOSIS — Z3202 Encounter for pregnancy test, result negative: Secondary | ICD-10-CM

## 2023-01-15 LAB — POCT PREGNANCY, URINE: Preg Test, Ur: NEGATIVE

## 2023-01-15 MED ORDER — SLYND 4 MG PO TABS: 1.0000 | ORAL_TABLET | Freq: Every day | ORAL | 2 refills | Status: AC

## 2023-01-15 NOTE — Progress Notes (Unsigned)
ANNUAL EXAM Patient name: Bonnie Cooper MRN [id number]  Date of birth: 10/17/1995 Chief Complaint:   Gynecologic Exam  History of Present Illness:   Tatanisha Cuthbert is a 27 y.o. G62P2002  female being seen today for a routine annual exam.  Current complaints: No pelvic or gyn complaints. Doing well on slynd. Has not started cycle yet this month. Reports missing a pill or being late on pill.   Patient's last menstrual period was 12/04/2022 (approximate).   The pregnancy intention screening data noted above was reviewed. Potential methods of contraception were discussed. The patient elected to proceed with POPs  Last pap 11/04/20. Results were: NILM w/ HRHPV negative. H/O abnormal pap: no Last mammogram: n/a. Results were: N/A. Family h/o breast cancer: no     01/15/2023   11:15 AM 07/18/2021    2:46 PM 07/11/2021   11:36 AM 06/24/2021    9:59 AM 05/25/2021    3:01 PM  Depression screen PHQ 2/9  Decreased Interest 0 0 0 0 0  Down, Depressed, Hopeless 0 0 0 0 0  PHQ - 2 Score 0 0 0 0 0  Altered sleeping 0 0 0 0 0  Tired, decreased energy 0 0 0 0 0  Change in appetite 0 0 0 0 0  Feeling bad or failure about yourself  0 0 0 0 0  Trouble concentrating 0 0 0 0 0  Moving slowly or fidgety/restless 0 0 0 0 0  Suicidal thoughts 0 0 0 0 0  PHQ-9 Score 0 0 0 0 0        01/15/2023   11:15 AM 07/18/2021    2:46 PM 07/11/2021   11:36 AM 06/24/2021    9:59 AM  GAD 7 : Generalized Anxiety Score  Nervous, Anxious, on Edge 0 0 0 0  Control/stop worrying 0 0 0 0  Worry too much - different things 0 0 0 0  Trouble relaxing 0 0 0 0  Restless 0 0 0 0  Easily annoyed or irritable 0 0 0 0  Afraid - awful might happen 0 0 0 0  Total GAD 7 Score 0 0 0 0     Review of Systems:   Pertinent items are noted in HPI Denies any headaches, blurred vision, fatigue, shortness of breath, chest pain, abdominal pain, abnormal vaginal discharge/itching/odor/irritation, problems with periods, bowel movements,  urination, or intercourse unless otherwise stated above. Pertinent History Reviewed:  Reviewed past medical,surgical, social and family history.  Reviewed problem list, medications and allergies. Physical Assessment:   Vitals:   01/15/23 1057 01/15/23 1135  BP: (!) 140/94 (!) 140/99  Pulse: 74 75  Weight: 176 lb 12.8 oz (80.2 kg)   Height: 5\' 3"  (1.6 m)   Body mass index is 31.32 kg/m.        Physical Examination:   General appearance - well appearing, and in no distress  Mental status - alert, oriented   Psych:  She has a normal mood and affect  Skin - warm and dry  Chest - effort normal  Heart - normal rate   Neck:  midline trachea  Breasts - breasts appear normal, no suspicious masses, no skin or nipple changes or  axillary nodes  Abdomen - soft  Pelvic -deferred  Extremities:  No swelling or varicosities noted  Chaperone present for exam  Results for orders placed or performed in visit on 01/15/23 (from the past 24 hour(s))  Pregnancy, urine POC   Collection Time: 01/15/23 11:35  AM  Result Value Ref Range   Preg Test, Ur NEGATIVE NEGATIVE    Assessment & Plan:  1. Well woman exam Pap utd, encouraged routine self-breast exams   2. Screen for STD (sexually transmitted disease) Self swab collected today - Cervicovaginal ancillary only  3. Encounter for surveillance of contraceptive pills UPT negative, discussed missed pill window and how can affect cycle.  Refill sent for Slynd   Labs/procedures today:   Mammogram: @ 28yo, or sooner if problems   Orders Placed This Encounter  Procedures   Pregnancy, urine POC    Meds:  Meds ordered this encounter  Medications   Drospirenone (SLYND) 4 MG TABS    Sig: Take 1 tablet (4 mg total) by mouth daily.    Dispense:  90 tablet    Refill:  2    Follow-up: Return in about 1 year (around 01/15/2024) for Thayer Jew, FNP 01/15/2023 5:23 PM

## 2023-01-16 LAB — CERVICOVAGINAL ANCILLARY ONLY
Bacterial Vaginitis (gardnerella): POSITIVE — AB
Candida Glabrata: NEGATIVE
Candida Vaginitis: NEGATIVE
Chlamydia: NEGATIVE
Comment: NEGATIVE
Comment: NEGATIVE
Comment: NEGATIVE
Comment: NEGATIVE
Comment: NEGATIVE
Comment: NORMAL
Neisseria Gonorrhea: NEGATIVE
Trichomonas: NEGATIVE

## 2023-01-16 MED ORDER — METRONIDAZOLE 500 MG PO TABS
500.0000 mg | ORAL_TABLET | Freq: Two times a day (BID) | ORAL | 0 refills | Status: AC
Start: 2023-01-16 — End: 2023-01-23

## 2023-01-16 NOTE — Addendum Note (Signed)
Addended by: Sue Lush on: 01/16/2023 10:13 PM   Modules accepted: Orders

## 2023-01-28 ENCOUNTER — Encounter: Payer: Self-pay | Admitting: Obstetrics and Gynecology

## 2023-01-28 DIAGNOSIS — N898 Other specified noninflammatory disorders of vagina: Secondary | ICD-10-CM

## 2023-01-29 MED ORDER — FLUCONAZOLE 150 MG PO TABS: 150.0000 mg | ORAL_TABLET | Freq: Once | ORAL | 0 refills | Status: AC

## 2023-01-31 ENCOUNTER — Other Ambulatory Visit: Payer: Self-pay | Admitting: *Deleted

## 2023-01-31 MED ORDER — FLUCONAZOLE 150 MG PO TABS
150.0000 mg | ORAL_TABLET | Freq: Once | ORAL | 0 refills | Status: AC
Start: 2023-01-31 — End: 2023-01-31

## 2023-04-16 ENCOUNTER — Encounter: Payer: Self-pay | Admitting: Family Medicine

## 2023-04-16 ENCOUNTER — Encounter: Payer: Self-pay | Admitting: Obstetrics and Gynecology

## 2023-04-18 ENCOUNTER — Ambulatory Visit (INDEPENDENT_AMBULATORY_CARE_PROVIDER_SITE_OTHER): Payer: Managed Care, Other (non HMO)

## 2023-04-18 ENCOUNTER — Other Ambulatory Visit: Payer: Self-pay

## 2023-04-18 ENCOUNTER — Other Ambulatory Visit (HOSPITAL_COMMUNITY)
Admission: RE | Admit: 2023-04-18 | Discharge: 2023-04-18 | Disposition: A | Discharge status: Home / Self Care | Payer: Managed Care, Other (non HMO) | Source: Ambulatory Visit | Attending: Family Medicine | Admitting: Family Medicine

## 2023-04-18 VITALS — BP 146/92 | HR 70 | Ht 63.0 in | Wt 172.0 lb

## 2023-04-18 DIAGNOSIS — Z113 Encounter for screening for infections with a predominantly sexual mode of transmission: Secondary | ICD-10-CM | POA: Insufficient documentation

## 2023-04-18 DIAGNOSIS — R300 Dysuria: Secondary | ICD-10-CM

## 2023-04-18 LAB — POCT URINALYSIS DIP (DEVICE)
Bilirubin Urine: NEGATIVE
Glucose, UA: NEGATIVE mg/dL
Hgb urine dipstick: NEGATIVE
Ketones, ur: NEGATIVE mg/dL
Leukocytes,Ua: NEGATIVE
Nitrite: POSITIVE — AB
Protein, ur: NEGATIVE mg/dL
Specific Gravity, Urine: >=1.03 (ref 1.005–1.030)
Urobilinogen, UA: 1 mg/dL (ref 0.0–1.0)
pH: 6 (ref 5.0–8.0)

## 2023-04-18 MED ORDER — NITROFURANTOIN MONOHYD MACRO 100 MG PO CAPS: 100.0000 mg | ORAL_CAPSULE | Freq: Two times a day (BID) | ORAL | 0 refills | Status: AC

## 2023-04-18 MED ORDER — PHENAZOPYRIDINE HCL 200 MG PO TABS: 200.0000 mg | ORAL_TABLET | Freq: Three times a day (TID) | ORAL | 0 refills | Status: AC | PRN

## 2023-04-18 NOTE — Progress Notes (Signed)
  Bonnie Cooper is here with concern of possible UTI. Patient reports frequent urination, urgency, cloudy urine, some odor and pain after voiding. These symptoms have been present for 3 days. Urinalysis obtained. Resulted positive for nitrate. Per protocol, macrobid  100 BID order along with pyridium  200 mg TID PRN for 2 days. Urine cultures sent. Informed patient to take medication with food and that pyridium  will make urine red/orange.  Patient also request to do a self swab. Patient reports she had unprotected intercourse and would like to have a STI on the swab. Patient endorses slight vaginal itchiness. No discharge present.  Self swab instructions given and specimen obtained. Explained patient will be contacted with any abnormal results.  Patient also had elevated BP during nurse visit. Initial BP was 131/98 and recheck BP was 146/92. Patient voiced that she has a PCP. Advised patient to contact PCP regarding her BP. Patient voiced that she gets care a [facility] and will follow up with her PCP.   Rosaline Pendleton, RN 04/18/2023  5:32 PM

## 2023-04-19 LAB — CERVICOVAGINAL ANCILLARY ONLY
Bacterial Vaginitis (gardnerella): POSITIVE — AB
Candida Glabrata: NEGATIVE
Candida Vaginitis: NEGATIVE
Chlamydia: NEGATIVE
Comment: NEGATIVE
Comment: NEGATIVE
Comment: NEGATIVE
Comment: NEGATIVE
Comment: NEGATIVE
Comment: NORMAL
Neisseria Gonorrhea: NEGATIVE
Trichomonas: NEGATIVE

## 2023-04-20 ENCOUNTER — Other Ambulatory Visit: Payer: Self-pay | Admitting: Obstetrics and Gynecology

## 2023-04-20 ENCOUNTER — Encounter: Payer: Self-pay | Admitting: Obstetrics and Gynecology

## 2023-04-20 DIAGNOSIS — N760 Acute vaginitis: Secondary | ICD-10-CM

## 2023-04-20 MED ORDER — METRONIDAZOLE 500 MG PO TABS: 500.0000 mg | ORAL_TABLET | Freq: Two times a day (BID) | ORAL | 0 refills | Status: AC

## 2023-04-21 LAB — URINE CULTURE
